# Patient Record
Sex: Male | Born: 1945
Health system: Southern US, Community
[De-identification: ages and names within clinical notes are randomized; demographics above are authoritative.]

## PROBLEM LIST (undated history)

## (undated) DIAGNOSIS — G709 Myoneural disorder, unspecified: Secondary | ICD-10-CM

## (undated) DIAGNOSIS — G894 Chronic pain syndrome: Secondary | ICD-10-CM

## (undated) DIAGNOSIS — M1990 Unspecified osteoarthritis, unspecified site: Secondary | ICD-10-CM

## (undated) HISTORY — PX: NEPHRECTOMY: SHX65

---

## 1998-09-29 ENCOUNTER — Ambulatory Visit (HOSPITAL_COMMUNITY): Admission: RE | Admit: 1998-09-29 | Discharge: 1998-09-29 | Payer: Self-pay | Admitting: Gastroenterology

## 2001-08-20 ENCOUNTER — Encounter: Payer: Self-pay | Admitting: Internal Medicine

## 2001-08-20 ENCOUNTER — Ambulatory Visit (HOSPITAL_COMMUNITY): Admission: RE | Admit: 2001-08-20 | Discharge: 2001-08-20 | Payer: Self-pay | Admitting: Internal Medicine

## 2001-12-25 ENCOUNTER — Ambulatory Visit (HOSPITAL_COMMUNITY): Admission: RE | Admit: 2001-12-25 | Discharge: 2001-12-25 | Payer: Self-pay | Admitting: Gastroenterology

## 2002-02-08 ENCOUNTER — Encounter: Admission: RE | Admit: 2002-02-08 | Discharge: 2002-02-08 | Payer: Self-pay | Admitting: Gastroenterology

## 2002-02-08 ENCOUNTER — Encounter: Payer: Self-pay | Admitting: Gastroenterology

## 2012-07-06 ENCOUNTER — Encounter (HOSPITAL_COMMUNITY): Payer: Self-pay | Admitting: Pharmacy Technician

## 2012-07-09 ENCOUNTER — Encounter (HOSPITAL_COMMUNITY)
Admission: RE | Admit: 2012-07-09 | Discharge: 2012-07-09 | Disposition: A | Discharge status: Home / Self Care | Payer: Medicare Other | Source: Ambulatory Visit | Attending: Orthopedic Surgery | Admitting: Orthopedic Surgery

## 2012-07-09 ENCOUNTER — Encounter (HOSPITAL_COMMUNITY): Payer: Self-pay

## 2012-07-09 DIAGNOSIS — G894 Chronic pain syndrome: Secondary | ICD-10-CM

## 2012-07-09 DIAGNOSIS — G709 Myoneural disorder, unspecified: Secondary | ICD-10-CM | POA: Insufficient documentation

## 2012-07-09 HISTORY — DX: Unspecified osteoarthritis, unspecified site: M19.90

## 2012-07-09 HISTORY — DX: Myoneural disorder, unspecified: G70.9

## 2012-07-09 HISTORY — DX: Chronic pain syndrome: G89.4

## 2012-07-09 LAB — CBC
MCH: 31.6 pg (ref 26.0–34.0)
MCV: 92.9 fL (ref 78.0–100.0)
Platelets: 184 10*3/uL (ref 150–400)
RBC: 4.81 MIL/uL (ref 4.22–5.81)
RDW: 13.7 % (ref 11.5–15.5)
WBC: 7.7 10*3/uL (ref 4.0–10.5)

## 2012-07-09 LAB — BASIC METABOLIC PANEL
Calcium: 10.1 mg/dL (ref 8.4–10.5)
Creatinine, Ser: 0.96 mg/dL (ref 0.50–1.35)
GFR calc non Af Amer: 86 mL/min — ABNORMAL LOW (ref >90)
Sodium: 139 mEq/L (ref 135–145)

## 2012-07-09 LAB — URINALYSIS, ROUTINE W REFLEX MICROSCOPIC
Glucose, UA: NEGATIVE mg/dL
Hgb urine dipstick: NEGATIVE
Ketones, ur: NEGATIVE mg/dL
Protein, ur: NEGATIVE mg/dL
Urobilinogen, UA: 0.2 mg/dL (ref 0.0–1.0)

## 2012-07-09 LAB — APTT: aPTT: 28 seconds (ref 24–37)

## 2012-07-09 LAB — SURGICAL PCR SCREEN: MRSA, PCR: NEGATIVE

## 2012-07-09 LAB — PROTIME-INR: Prothrombin Time: 13.8 seconds (ref 11.6–15.2)

## 2012-07-09 NOTE — Patient Instructions (Addendum)
21 Christopher Wheeler  07/09/2012   Your procedure is scheduled on:  12-24 -2013  Report to [facility] at     0630   AM..  Call this number if you have problems the morning of surgery: [phone]  Or Presurgical Testing [phone]([name])     Do not eat food:After Midnight.   Take these medicines the morning of surgery with A SIP OF WATER: Oxycodone. Oxycontin. Gabapentin. Baclofen.   Do not wear jewelry, make-up or nail polish.  Do not wear lotions, powders, or perfumes. You may wear deodorant.  Do not shave 48 hours prior to surgery.(face and neck okay, no shaving of legs)  Do not bring valuables to the hospital.  Contacts, dentures or bridgework may not be worn into surgery.  Leave suitcase in the car. After surgery it may be brought to your room.  For patients admitted to the hospital, checkout time is 11:00 AM the day of discharge.   Patients discharged the day of surgery will not be allowed to drive home. Must have responsible person with you x 24 hours once discharged.  Name and phone number of your driver: kapena, hamme [phone]  Special Instructions: CHG Shower Use Special Wash: see special instructions.(avoid face and genitals)   Please read over the following fact sheets that you were given: MRSA Information, Blood Transfusion fact sheet, Incentive Spirometry Instruction.    Failure to follow these instructions may result in Cancellation of your surgery.   Patient signature_______________________________________________________

## 2012-07-09 NOTE — Pre-Procedure Instructions (Addendum)
07-09-12 Pt. Told by pharmacy to bring empty Rx. Bottles for Oxycontin and oxycodone to verify dosages.W. Jordan Caraveo,Rn 07-09-12 EKG 3'12-report with chart.W. Kennon Portela

## 2012-07-14 ENCOUNTER — Ambulatory Visit: Payer: Medicare Other | Admitting: Family Medicine

## 2012-07-14 VITALS — BP 130/80 | HR 80 | Temp 98.6°F | Resp 18 | Ht 76.0 in | Wt 213.0 lb

## 2012-07-14 DIAGNOSIS — G587 Mononeuritis multiplex: Secondary | ICD-10-CM

## 2012-07-14 DIAGNOSIS — M169 Osteoarthritis of hip, unspecified: Secondary | ICD-10-CM

## 2012-07-14 DIAGNOSIS — E785 Hyperlipidemia, unspecified: Secondary | ICD-10-CM

## 2012-07-14 DIAGNOSIS — Z01818 Encounter for other preprocedural examination: Secondary | ICD-10-CM

## 2012-07-14 NOTE — Patient Instructions (Signed)
Your cleared for surgery. No special recommendations.

## 2012-07-14 NOTE — Progress Notes (Signed)
PREOPERATIVE MEDICAL CONSULTATION  History: Patient is here for a preop medical examination. He is scheduled to undergo a left hip arthroplasty on December 24. He has no acute medical complaints, but needed a preoperative evaluation. His left hip is degenerated and is shortened his height 1-1/2 inches on the left side from the deteriorated hip. 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 He developed arthritis in both hips, but the left. It terribly.  Past medical history: Medical problems: Allergies Mononeuritis multiplex Neuritis Right foot drop Myalgias Osteoarthritis of both hips Right side wasting Vestibular neuropathy with balance disorder and "cricket chirping" tinnitus Continual neuropathic pain Joint pains Hyperlipidemia  Previous surgeries: Surgery 1968 Colonoscopy 3 times Spermatocele 07/11/2010  Medications: Alprazolam Naproxen Cyclobenzaprine Sennosides Niacin Oxycodone Sildenafil Gabapentin Baclofen Control Cholecalciferol Lactobacillus Ergocalciferol OTC medications include: Stool softener Mineral Multivitamin msm Valerian root Garlic oil  Allergies: Doxycycline Penicillin Methadone Morphine Simvastatin  Family history: Parents are both deceased. Mother died from osteoarthritis and hypertension, father from metastatic melanoma  Social history: Patient is married. He is disabled. Formerly was a Sport and exercise psychologist, worked for [employer]. No alcohol or drug use. Nonsmoker. Has  college education. Does not exercise. Has no children.  Review of systems: Constitutional: Unremarkable HEENT: Has ear problems from his neurologic disease. This includes dizziness. Cardiovascular: Unremarkable Respiratory: Unremarkable GI: Unremarkable. Has to take up a lot of laxative agents because the medications tend toward constipation. Genitourinary: Unremarkable Musculoskeletal: Very painful left hip. He  is very weak due to her limited mobility. Neurologic: Very meticulous speech secondary to damage caused by this disease process. His mental function is good.  Psychiatric:Unremarkable Dermatologic: Unremarkable  Physical examination: Well-developed well-nourished man in no major distress. He talks with the meticulous speech as described above. HEENT. TMs normal. Eyes PERRLA. Fundi a little difficult to visualize well. Throat clear. No carotid bruits. Chest clear. Heart regular without murmurs gallops or arrhythmias. Abdomen soft without mass or tenderness. No abdominal bruits. Peripheral pulses good. Extremities not examined in detail since he cannot get up and move around for me. Has on shoes and stockings, which I did not take off. He said the orthopedic physician had checked his pulses.   PREOP MEDICAL CONSULT ASSESSMENT:: DJD left hip History of hyperlipidemia Chronic pain syndrome Mononeuritis multiplex and associated problems listed above  Plan: Labs were reviewed. EKG was done and was normal. Everything looks good for him to have surgery. He knows what medications to withhold prior to surgery. He is cleared for surgery.

## 2012-07-16 NOTE — H&P (Signed)
TOTAL HIP ADMISSION H&P  Patient is admitted for left total hip arthroplasty, anterior approach.  Subjective:  Chief Complaint:  Left hip OA / pain  HPI: Christopher Wheeler, 66 y.o. male, has a history of pain and functional disability in the left hip(s) due to arthritis and patient has failed non-surgical conservative treatments for greater than 12 weeks to include NSAID's and/or analgesics, use of assistive devices and activity modification.  Onset of symptoms was gradual starting 2 years ago with gradually worsening course since that time.  In the Spring of 2011 he felt the first "pop" in the left hip. He then had an incident in November of 2011 where the pain caused him to fall in the shower.  In September of 2012 he started wearing a 1 inch belt under his pants that encircles both hip, which makes the left hip feel better. The patient noted no past surgery on the left hip(s).  Patient currently rates pain in the left hip at 10 out of 10 with activity. Patient has night pain, worsening of pain with activity and weight bearing, trendelenberg gait, pain that interfers with activities of daily living and pain with passive range of motion. Patient has evidence of periarticular osteophytes and joint space narrowing by imaging studies. This condition presents safety issues increasing the risk of falls. There is no current active infection.  He also has mononeuritis multiplex which attributes to right sided weakness, drop foot and other issues on the right side. He already walks slowly with a walker. Risks, benefits and expectations were discussed with the patient. Patient understand the risks, benefits and expectations and wishes to proceed with surgery.   D/C Plans:  Home with HHPT vs SNF - however would prefer to go home  Post-op Meds:  No Rx given   Tranexamic Acid:  To be given  Decadron:   To be given   FYI:  Already on Oxycotin 20 mg tid, will need to continue this after surgery.          Also on  Oxycodone 5 mg tid fr break through pain, will need Oxycodone more regularly after surgery.          Aready has a regimen for controlling his bowels and would like to continue with his regimen.          Would like for PT to be aware of his mononeuritis multiplex attributing to his right sided weakness, drop foot and slow speech.   Past Medical History  Diagnosis Date  . Neuromuscular disorder 07-09-12    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, C4 lesion)-right side muscle wasting  . Arthritis     osteoarthritis-hips  . Chronic pain disorder 07-09-12    under chronic pain management through [facility]     Past Surgical History  Procedure Date  . Colonoscopy w/ polypectomy     No prescriptions prior to admission   Allergies  Allergen Reactions  . Methadone     respiration and blood pressure depressed   . Morphine And Related     respiration and blood pressure depressed   . Other     Potato: coughing Hops: coughing  . Simvastatin Nausea And Vomiting  . Doxycycline Rash  . Penicillins Rash    History  Substance Use Topics  . Smoking status: Former Smoker    Types: Cigarettes, Pipe    Quit date: 07/09/2000  . Smokeless tobacco: Not on file  . Alcohol Use: No  Comment: 1'2007-none since pain management tx.       Review of Systems  Constitutional: Negative.   HENT: Positive for hearing loss and tinnitus.   Eyes: Negative.   Respiratory: Positive for shortness of breath.   Cardiovascular: Negative.   Gastrointestinal: Negative.   Genitourinary: Negative.   Musculoskeletal: Positive for myalgias, back pain and joint pain.  Skin: Positive for rash (eczema).  Neurological: Positive for sensory change, speech change and focal weakness.  Endo/Heme/Allergies: Positive for environmental allergies.  Psychiatric/Behavioral: Negative.     Objective:  Physical Exam  Constitutional: He is oriented to person, place, and time. He appears  well-developed and well-nourished.  HENT:  Head: Normocephalic and atraumatic.  Mouth/Throat: Oropharynx is clear and moist.  Eyes: Pupils are equal, round, and reactive to light.  Neck: Neck supple. No JVD present. No tracheal deviation present. No thyromegaly present.  Cardiovascular: Normal rate, regular rhythm, normal heart sounds and intact distal pulses.   Respiratory: Effort normal and breath sounds normal. No stridor. No respiratory distress. He has no wheezes.  GI: Soft. There is no tenderness. There is no guarding.  Musculoskeletal:       Left hip: He exhibits decreased range of motion, decreased strength, tenderness and bony tenderness. He exhibits no swelling, no deformity and no laceration.  Lymphadenopathy:    He has no cervical adenopathy.  Neurological: He is alert and oriented to person, place, and time.  Skin: Skin is warm and dry.    Imaging Review Plain radiographs demonstrate severe degenerative joint disease of the left hip(s). The bone quality appears to be good for age and reported activity level.  Assessment/Plan:  End stage arthritis, left hip(s)  The patient history, physical examination, clinical judgement of the provider and imaging studies are consistent with end stage degenerative joint disease of the left hip(s) and total hip arthroplasty is deemed medically necessary. The treatment options including medical management, injection therapy, arthroscopy and arthroplasty were discussed at length. The risks and benefits of total hip arthroplasty were presented and reviewed. The risks due to aseptic loosening, infection, stiffness, dislocation/subluxation,  thromboembolic complications and other imponderables were discussed.  The patient acknowledged the explanation, agreed to proceed with the plan and consent was signed. Patient is being admitted for inpatient treatment for surgery, pain control, PT, OT, prophylactic antibiotics, VTE prophylaxis, progressive  ambulation and ADL's and discharge planning.The patient is planning to be discharged to skilled nursing facility vs home, would prefer home.     Anastasio Auerbach Kimyetta Flott   PAC  07/16/2012, 5:11 PM

## 2012-07-21 ENCOUNTER — Encounter (HOSPITAL_COMMUNITY)
Admission: RE | Disposition: A | Discharge status: Home Health | Payer: Self-pay | Source: Ambulatory Visit | Attending: Orthopedic Surgery

## 2012-07-21 ENCOUNTER — Inpatient Hospital Stay (HOSPITAL_COMMUNITY): Payer: Medicare Other

## 2012-07-21 ENCOUNTER — Inpatient Hospital Stay (HOSPITAL_COMMUNITY): Payer: Medicare Other | Admitting: Anesthesiology

## 2012-07-21 ENCOUNTER — Encounter (HOSPITAL_COMMUNITY): Payer: Self-pay | Admitting: *Deleted

## 2012-07-21 ENCOUNTER — Inpatient Hospital Stay (HOSPITAL_COMMUNITY)
Admission: RE | Admit: 2012-07-21 | Discharge: 2012-07-22 | DRG: 470 | Disposition: A | Discharge status: Home Health | Payer: Medicare Other | Source: Ambulatory Visit | Attending: Orthopedic Surgery | Admitting: Orthopedic Surgery

## 2012-07-21 ENCOUNTER — Encounter (HOSPITAL_COMMUNITY): Payer: Self-pay | Admitting: Anesthesiology

## 2012-07-21 DIAGNOSIS — M1610 Unilateral primary osteoarthritis, unspecified hip: Principal | ICD-10-CM | POA: Diagnosis present

## 2012-07-21 DIAGNOSIS — E663 Overweight: Secondary | ICD-10-CM

## 2012-07-21 DIAGNOSIS — D500 Iron deficiency anemia secondary to blood loss (chronic): Secondary | ICD-10-CM

## 2012-07-21 DIAGNOSIS — Z01812 Encounter for preprocedural laboratory examination: Secondary | ICD-10-CM

## 2012-07-21 DIAGNOSIS — M169 Osteoarthritis of hip, unspecified: Principal | ICD-10-CM | POA: Diagnosis present

## 2012-07-21 DIAGNOSIS — Z96649 Presence of unspecified artificial hip joint: Secondary | ICD-10-CM

## 2012-07-21 DIAGNOSIS — D62 Acute posthemorrhagic anemia: Secondary | ICD-10-CM | POA: Diagnosis not present

## 2012-07-21 HISTORY — PX: TOTAL HIP ARTHROPLASTY: SHX124

## 2012-07-21 LAB — ABO/RH: ABO/RH(D): A POS

## 2012-07-21 LAB — TYPE AND SCREEN: ABO/RH(D): A POS

## 2012-07-21 SURGERY — ARTHROPLASTY, HIP, TOTAL, ANTERIOR APPROACH
Anesthesia: Spinal | Site: Hip | Laterality: Left | Wound class: Clean

## 2012-07-21 MED ORDER — METOCLOPRAMIDE HCL 5 MG/ML IJ SOLN: 5.0000 mg | Freq: Three times a day (TID) | INTRAMUSCULAR | Status: DC | PRN

## 2012-07-21 MED ORDER — HYDROMORPHONE HCL PF 1 MG/ML IJ SOLN
0.5000 mg | INTRAMUSCULAR | Status: DC | PRN
Start: 2012-07-21 — End: 2012-07-22

## 2012-07-21 MED ORDER — PHENOL 1.4 % MT LIQD
1.0000 | OROMUCOSAL | Status: DC | PRN
  Filled 2012-07-21: qty 177

## 2012-07-21 MED ORDER — BISACODYL 10 MG RE SUPP: 10.0000 mg | Freq: Every day | RECTAL | Status: DC | PRN

## 2012-07-21 MED ORDER — ONDANSETRON HCL 4 MG/2ML IJ SOLN: 4.0000 mg | Freq: Four times a day (QID) | INTRAMUSCULAR | Status: DC | PRN

## 2012-07-21 MED ORDER — VITAMIN D3 25 MCG (1000 UNIT) PO TABS
1000.0000 [IU] | ORAL_TABLET | Freq: Three times a day (TID) | ORAL | Status: DC
  Administered 2012-07-21 – 2012-07-22 (×2): 1000 [IU] via ORAL
  Filled 2012-07-21 (×5): qty 1

## 2012-07-21 MED ORDER — STERILE WATER FOR IRRIGATION IR SOLN
Status: DC | PRN
  Administered 2012-07-21: 3000 mL

## 2012-07-21 MED ORDER — ONDANSETRON HCL 4 MG PO TABS: 4.0000 mg | ORAL_TABLET | Freq: Four times a day (QID) | ORAL | Status: DC | PRN

## 2012-07-21 MED ORDER — PROPOFOL 10 MG/ML IV BOLUS
INTRAVENOUS | Status: DC | PRN
  Administered 2012-07-21 (×2): 20 mg via INTRAVENOUS

## 2012-07-21 MED ORDER — DEXAMETHASONE SODIUM PHOSPHATE 10 MG/ML IJ SOLN
INTRAMUSCULAR | Status: DC | PRN
  Administered 2012-07-21: 10 mg via INTRAVENOUS

## 2012-07-21 MED ORDER — METOCLOPRAMIDE HCL 10 MG PO TABS: 5.0000 mg | ORAL_TABLET | Freq: Three times a day (TID) | ORAL | Status: DC | PRN

## 2012-07-21 MED ORDER — PROMETHAZINE HCL 25 MG/ML IJ SOLN: 6.2500 mg | INTRAMUSCULAR | Status: DC | PRN

## 2012-07-21 MED ORDER — BACLOFEN 10 MG PO TABS
10.0000 mg | ORAL_TABLET | Freq: Two times a day (BID) | ORAL | Status: DC
  Administered 2012-07-21 – 2012-07-22 (×3): 10 mg via ORAL
  Filled 2012-07-21 (×4): qty 1

## 2012-07-21 MED ORDER — LACTATED RINGERS IV SOLN
INTRAVENOUS | Status: DC | PRN
  Administered 2012-07-21 (×2): via INTRAVENOUS

## 2012-07-21 MED ORDER — OXYCODONE HCL 5 MG PO TABS
5.0000 mg | ORAL_TABLET | ORAL | Status: DC
  Administered 2012-07-21: 15 mg via ORAL
  Administered 2012-07-21 (×2): 10 mg via ORAL
  Administered 2012-07-22 (×2): 5 mg via ORAL
  Administered 2012-07-22 (×2): 10 mg via ORAL
  Filled 2012-07-21: qty 2
  Filled 2012-07-21: qty 3
  Filled 2012-07-21 (×5): qty 2

## 2012-07-21 MED ORDER — 0.9 % SODIUM CHLORIDE (POUR BTL) OPTIME
TOPICAL | Status: DC | PRN
  Administered 2012-07-21: 1000 mL

## 2012-07-21 MED ORDER — LACTINEX PO CHEW
2.0000 | CHEWABLE_TABLET | Freq: Two times a day (BID) | ORAL | Status: DC
  Administered 2012-07-21: 2 via ORAL
  Filled 2012-07-21 (×4): qty 2

## 2012-07-21 MED ORDER — ALPRAZOLAM 0.25 MG PO TABS: 0.2500 mg | ORAL_TABLET | Freq: Every evening | ORAL | Status: DC | PRN

## 2012-07-21 MED ORDER — GABAPENTIN 300 MG PO CAPS
600.0000 mg | ORAL_CAPSULE | Freq: Three times a day (TID) | ORAL | Status: DC
  Administered 2012-07-21 – 2012-07-22 (×3): 600 mg via ORAL
  Filled 2012-07-21 (×5): qty 2

## 2012-07-21 MED ORDER — NIACIN ER 500 MG PO TBCR
1000.0000 mg | EXTENDED_RELEASE_TABLET | Freq: Every day | ORAL | Status: DC
  Administered 2012-07-21: 1000 mg via ORAL
  Filled 2012-07-21 (×2): qty 2

## 2012-07-21 MED ORDER — LACTATED RINGERS IV SOLN: INTRAVENOUS | Status: DC

## 2012-07-21 MED ORDER — OMEGA-3-ACID ETHYL ESTERS 1 G PO CAPS
2.0000 g | ORAL_CAPSULE | Freq: Three times a day (TID) | ORAL | Status: DC
  Administered 2012-07-21 – 2012-07-22 (×2): 2 g via ORAL
  Filled 2012-07-21 (×5): qty 2

## 2012-07-21 MED ORDER — DIPHENHYDRAMINE HCL 25 MG PO CAPS: 25.0000 mg | ORAL_CAPSULE | Freq: Four times a day (QID) | ORAL | Status: DC | PRN

## 2012-07-21 MED ORDER — FLEET ENEMA 7-19 GM/118ML RE ENEM: 1.0000 | ENEMA | Freq: Once | RECTAL | Status: AC | PRN

## 2012-07-21 MED ORDER — SENNOSIDES 8.6 MG PO TABS
4.0000 | ORAL_TABLET | Freq: Two times a day (BID) | ORAL | Status: DC
  Administered 2012-07-21 – 2012-07-22 (×3): 34.4 mg via ORAL
  Filled 2012-07-21 (×4): qty 4

## 2012-07-21 MED ORDER — OXYCODONE HCL 20 MG PO TB12
20.0000 mg | ORAL_TABLET | Freq: Three times a day (TID) | ORAL | Status: DC
  Filled 2012-07-21 (×2): qty 1

## 2012-07-21 MED ORDER — MINERAL OIL PO OIL
45.0000 mL | TOPICAL_OIL | Freq: Three times a day (TID) | ORAL | Status: DC
  Filled 2012-07-21 (×2): qty 60

## 2012-07-21 MED ORDER — ACETAMINOPHEN 10 MG/ML IV SOLN
INTRAVENOUS | Status: DC | PRN
  Administered 2012-07-21: 1000 mg via INTRAVENOUS

## 2012-07-21 MED ORDER — NIACIN ER (ANTIHYPERLIPIDEMIC) 1000 MG PO TBCR: 1000.0000 mg | EXTENDED_RELEASE_TABLET | Freq: Every day | ORAL | Status: DC

## 2012-07-21 MED ORDER — OXYCODONE HCL ER 20 MG PO T12A
20.0000 mg | EXTENDED_RELEASE_TABLET | Freq: Three times a day (TID) | ORAL | Status: DC
Start: 2012-07-21 — End: 2012-07-22
  Administered 2012-07-21 – 2012-07-22 (×3): 20 mg via ORAL
  Filled 2012-07-21 (×3): qty 1

## 2012-07-21 MED ORDER — CELECOXIB 200 MG PO CAPS
200.0000 mg | ORAL_CAPSULE | Freq: Two times a day (BID) | ORAL | Status: DC
  Administered 2012-07-21 – 2012-07-22 (×3): 200 mg via ORAL
  Filled 2012-07-21 (×4): qty 1

## 2012-07-21 MED ORDER — FERROUS SULFATE 325 (65 FE) MG PO TABS
325.0000 mg | ORAL_TABLET | Freq: Three times a day (TID) | ORAL | Status: DC
  Administered 2012-07-21 – 2012-07-22 (×3): 325 mg via ORAL
  Filled 2012-07-21 (×6): qty 1

## 2012-07-21 MED ORDER — CEFAZOLIN SODIUM-DEXTROSE 2-3 GM-% IV SOLR
2.0000 g | Freq: Four times a day (QID) | INTRAVENOUS | Status: AC
  Administered 2012-07-21 (×2): 2 g via INTRAVENOUS
  Filled 2012-07-21 (×2): qty 50

## 2012-07-21 MED ORDER — RIVAROXABAN 10 MG PO TABS
10.0000 mg | ORAL_TABLET | ORAL | Status: DC
  Administered 2012-07-22: 10 mg via ORAL
  Filled 2012-07-21 (×2): qty 1

## 2012-07-21 MED ORDER — PROPOFOL INFUSION 10 MG/ML OPTIME
INTRAVENOUS | Status: DC | PRN
  Administered 2012-07-21: 50 ug/kg/min via INTRAVENOUS

## 2012-07-21 MED ORDER — FENTANYL CITRATE 0.05 MG/ML IJ SOLN
INTRAMUSCULAR | Status: DC | PRN
  Administered 2012-07-21: 50 ug via INTRAVENOUS

## 2012-07-21 MED ORDER — CYCLOBENZAPRINE HCL 10 MG PO TABS
10.0000 mg | ORAL_TABLET | Freq: Every day | ORAL | Status: DC
  Administered 2012-07-21: 10 mg via ORAL
  Filled 2012-07-21 (×2): qty 1

## 2012-07-21 MED ORDER — MINERAL OIL PO OIL
TOPICAL_OIL | Freq: Three times a day (TID) | ORAL | Status: DC
  Filled 2012-07-21 (×2): qty 45

## 2012-07-21 MED ORDER — MIDAZOLAM HCL 5 MG/5ML IJ SOLN
INTRAMUSCULAR | Status: DC | PRN
  Administered 2012-07-21: 2 mg via INTRAVENOUS

## 2012-07-21 MED ORDER — DEXAMETHASONE SODIUM PHOSPHATE 10 MG/ML IJ SOLN: 10.0000 mg | Freq: Once | INTRAMUSCULAR | Status: DC

## 2012-07-21 MED ORDER — TRANEXAMIC ACID 100 MG/ML IV SOLN
1450.0000 mg | Freq: Once | INTRAVENOUS | Status: AC
  Administered 2012-07-21: 1449 mg via INTRAVENOUS
  Filled 2012-07-21: qty 14.5

## 2012-07-21 MED ORDER — SODIUM CHLORIDE 0.9 % IV SOLN
100.0000 mL/h | INTRAVENOUS | Status: DC
  Administered 2012-07-21: 100 mL/h via INTRAVENOUS
  Filled 2012-07-21 (×4): qty 1000

## 2012-07-21 MED ORDER — FENTANYL CITRATE 0.05 MG/ML IJ SOLN
50.0000 ug | INTRAMUSCULAR | Status: DC | PRN
  Administered 2012-07-21 (×4): 50 ug via INTRAVENOUS

## 2012-07-21 MED ORDER — CEFAZOLIN SODIUM-DEXTROSE 2-3 GM-% IV SOLR
2.0000 g | INTRAVENOUS | Status: AC
  Administered 2012-07-21: 2 g via INTRAVENOUS

## 2012-07-21 MED ORDER — CHLORHEXIDINE GLUCONATE 4 % EX LIQD
60.0000 mL | Freq: Once | CUTANEOUS | Status: DC
  Filled 2012-07-21: qty 60

## 2012-07-21 MED ORDER — BUPIVACAINE HCL (PF) 0.5 % IJ SOLN
INTRAMUSCULAR | Status: DC | PRN
  Administered 2012-07-21: 15 mg

## 2012-07-21 MED ORDER — MENTHOL 3 MG MT LOZG
1.0000 | LOZENGE | OROMUCOSAL | Status: DC | PRN
  Filled 2012-07-21: qty 9

## 2012-07-21 MED ORDER — MINERAL OIL PO OIL
45.0000 mL | TOPICAL_OIL | Freq: Three times a day (TID) | ORAL | Status: DC
  Administered 2012-07-21 – 2012-07-22 (×3): 45 mL via ORAL
  Filled 2012-07-21 (×5): qty 45

## 2012-07-21 MED ORDER — DOCUSATE SODIUM 100 MG PO CAPS
100.0000 mg | ORAL_CAPSULE | Freq: Three times a day (TID) | ORAL | Status: DC
  Administered 2012-07-21 – 2012-07-22 (×3): 100 mg via ORAL

## 2012-07-21 MED ORDER — ALUM & MAG HYDROXIDE-SIMETH 200-200-20 MG/5ML PO SUSP: 30.0000 mL | ORAL | Status: DC | PRN

## 2012-07-21 MED ORDER — MEPERIDINE HCL 50 MG/ML IJ SOLN: 6.2500 mg | INTRAMUSCULAR | Status: DC | PRN

## 2012-07-21 MED ORDER — OXYCODONE HCL ER 20 MG PO T12A: 20.0000 mg | EXTENDED_RELEASE_TABLET | Freq: Three times a day (TID) | ORAL | Status: DC

## 2012-07-21 SURGICAL SUPPLY — 39 items
ADH SKN CLS APL DERMABOND .7 (GAUZE/BANDAGES/DRESSINGS) ×1
BAG SPEC THK2 15X12 ZIP CLS (MISCELLANEOUS) ×2
BAG ZIPLOCK 12X15 (MISCELLANEOUS) ×4 IMPLANT
BLADE SAW SGTL 18X1.27X75 (BLADE) ×2 IMPLANT
CLOTH BEACON ORANGE TIMEOUT ST (SAFETY) ×2 IMPLANT
DERMABOND ADVANCED (GAUZE/BANDAGES/DRESSINGS) ×1
DERMABOND ADVANCED .7 DNX12 (GAUZE/BANDAGES/DRESSINGS) ×1 IMPLANT
DRAPE C-ARM 42X72 X-RAY (DRAPES) ×2 IMPLANT
DRAPE STERI IOBAN 125X83 (DRAPES) ×2 IMPLANT
DRAPE U-SHAPE 47X51 STRL (DRAPES) ×6 IMPLANT
DRSG AQUACEL AG ADV 3.5X10 (GAUZE/BANDAGES/DRESSINGS) ×2 IMPLANT
DRSG TEGADERM 4X4.75 (GAUZE/BANDAGES/DRESSINGS) ×1 IMPLANT
DURAPREP 26ML APPLICATOR (WOUND CARE) ×2 IMPLANT
ELECT BLADE TIP CTD 4 INCH (ELECTRODE) ×2 IMPLANT
ELECT REM PT RETURN 9FT ADLT (ELECTROSURGICAL) ×2
ELECTRODE REM PT RTRN 9FT ADLT (ELECTROSURGICAL) ×1 IMPLANT
EVACUATOR 1/8 PVC DRAIN (DRAIN) IMPLANT
FACESHIELD LNG OPTICON STERILE (SAFETY) ×8 IMPLANT
GAUZE SPONGE 2X2 8PLY STRL LF (GAUZE/BANDAGES/DRESSINGS) ×1 IMPLANT
GLOVE BIOGEL PI IND STRL 7.5 (GLOVE) ×1 IMPLANT
GLOVE BIOGEL PI IND STRL 8 (GLOVE) ×1 IMPLANT
GLOVE BIOGEL PI INDICATOR 7.5 (GLOVE) ×1
GLOVE BIOGEL PI INDICATOR 8 (GLOVE) ×1
GLOVE ECLIPSE 8.0 STRL XLNG CF (GLOVE) ×2 IMPLANT
GLOVE ORTHO TXT STRL SZ7.5 (GLOVE) ×4 IMPLANT
GOWN BRE IMP PREV XXLGXLNG (GOWN DISPOSABLE) ×4 IMPLANT
GOWN STRL NON-REIN LRG LVL3 (GOWN DISPOSABLE) ×2 IMPLANT
KIT BASIN OR (CUSTOM PROCEDURE TRAY) ×2 IMPLANT
PACK TOTAL JOINT (CUSTOM PROCEDURE TRAY) ×2 IMPLANT
PADDING CAST COTTON 6X4 STRL (CAST SUPPLIES) ×2 IMPLANT
SPONGE GAUZE 2X2 STER 10/PKG (GAUZE/BANDAGES/DRESSINGS) ×1
SUCTION FRAZIER 12FR DISP (SUCTIONS) ×2 IMPLANT
SUT MNCRL AB 4-0 PS2 18 (SUTURE) ×2 IMPLANT
SUT VIC AB 1 CT1 36 (SUTURE) ×8 IMPLANT
SUT VIC AB 2-0 CT1 27 (SUTURE) ×4
SUT VIC AB 2-0 CT1 TAPERPNT 27 (SUTURE) ×2 IMPLANT
SUT VLOC 180 0 24IN GS25 (SUTURE) ×2 IMPLANT
TOWEL OR 17X26 10 PK STRL BLUE (TOWEL DISPOSABLE) ×4 IMPLANT
TRAY FOLEY CATH 14FRSI W/METER (CATHETERS) ×2 IMPLANT

## 2012-07-21 NOTE — Progress Notes (Signed)
Utilization review completed.  

## 2012-07-21 NOTE — Evaluation (Signed)
Occupational Therapy Evaluation Patient Details Name: Christopher Wheeler MRN: [id number] DOB: 12-Jan-1946 Today's Date: 07/21/2012 Time: 4098-1191 OT Time Calculation (min): 34 min  OT Assessment / Plan / Recommendation Clinical Impression  This 66 year old man was admitted for L THA, anterior direct approach.  He has a h/o mononeuritis with R sided weakness.  All education was completed, and pt does not need further OT.      OT Assessment  Patient does not need any further OT services    Follow Up Recommendations  No OT follow up    Barriers to Discharge      Equipment Recommendations  None recommended by OT    Recommendations for Other Services    Frequency       Precautions / Restrictions Precautions Precaution Comments: has h/o R foot drop. used AD  prior to admit. Restrictions Weight Bearing Restrictions: No   Pertinent Vitals/Pain No pain--pt states medications he takes for R side are covering any pain on L    ADL  Lower Body Dressing: Performed;Minimal assistance (pillowcase as pants, shoes, socks with AE) Where Assessed - Lower Body Dressing: Supported sit to stand Toilet Transfer: Counsellor Method: Sit to Barista:  (chair) Tub/Shower Transfer: Performed;Min guard Web designer Method: Science writer: Walk in Scientist, research (physical sciences) Used: Gait belt;Rolling walker (AE kit) Transfers/Ambulation Related to ADLs: ambulated to bathroom; stood at toilet to void ADL Comments: pt has AE from [place] but didn't use it; educated on use & pt return demonstrated    OT Diagnosis:    OT Problem List:   OT Treatment Interventions:     OT Goals    Visit Information  Last OT Received On: 07/21/12 Assistance Needed: +1    Subjective Data  Subjective: I have a sheet for you (PMH/home set up) Patient Stated Goal: be as independent as possible   Prior Functioning     Home Living Lives With:  Spouse Available Help at Discharge: Family Type of Home: House Home Access: Level entry Home Layout: One level Bathroom Shower/Tub: Health visitor: Standard Home Adaptive Equipment: Bedside commode/3-in-1;Walker - four wheeled;Walker - rolling;Reacher;Sock aid (AE kit including dressing stick) Prior Function Level of Independence: Independent with assistive device(s);Needs assistance Needs Assistance: Gait Gait Assistance: uses motorized cart in stores, uses 2 canes  when outside, uses 4 wheeled RW when needed. Vocation: Retired Film/video editor) Musician: No difficulties         Vision/Perception     Cognition  Overall Cognitive Status: Appears within functional limits for tasks assessed/performed Arousal/Alertness: Awake/alert Orientation Level: Appears intact for tasks assessed Behavior During Session: [facility] for tasks performed    Extremity/Trunk Assessment Right Upper Extremity Assessment RUE ROM/Strength/Tone: Within functional levels for tasks assessed Left Upper Extremity Assessment LUE ROM/Strength/Tone: Within functional levels Right Lower Extremity Assessment RLE ROM/Strength/Tone: Deficits RLE ROM/Strength/Tone Deficits: pt reports foot drop but did not appear to be a hinderance for gait. Left Lower Extremity Assessment LLE ROM/Strength/Tone: WFL for tasks assessed LLE Sensation: WFL - Light Touch     Mobility Bed Mobility Bed Mobility: Supine to Sit;Sitting - Scoot to Edge of Bed Supine to Sit: 5: Supervision;With rails Sitting - Scoot to Edge of Bed: 5: Supervision;With rail Details for Bed Mobility Assistance: pt. did not want PT to assist , pt sort of flung his legs together to get to edgeof bed. attempted to assist pt's L leg but he declined for need. Transfers Sit to Stand: 4:  Min guard;With upper extremity assist;From chair/3-in-1 Stand to Sit: 5: Supervision;With upper extremity assist Details for Transfer Assistance: VC  for LLE position to sit down , pt verbally reviewed backing up, reaching for arms     Shoulder Instructions     Exercise     Balance Static Sitting Balance Static Sitting - Balance Support: Bilateral upper extremity supported;Feet supported Static Sitting - Level of Assistance: 5: Stand by assistance   End of Session OT - End of Session Activity Tolerance: Patient tolerated treatment well Patient left: in chair;with call bell/phone within reach;with family/visitor present Nurse Communication:  (pt request to walk to bathroom to void)  GO     Tashawn Laswell 07/21/2012, 4:33 PM Marica Otter, OTR/L [phone] 07/21/2012

## 2012-07-21 NOTE — Op Note (Signed)
NAME:  Christopher Wheeler                ACCOUNT NO.: [id number]      MEDICAL RECORD NO.: [id number]      FACILITY:  [facility]      PHYSICIAN:  Durene Romans D  DATE OF BIRTH:  05-26-1946     DATE OF PROCEDURE:  07/21/2012                                 OPERATIVE REPORT         PREOPERATIVE DIAGNOSIS: Left  hip osteoarthritis.      POSTOPERATIVE DIAGNOSIS:  Left hip very advanced osteoarthritis.      PROCEDURE:  Left total hip replacement through an anterior approach   utilizing DePuy THR system, component size 66mm pinnacle cup, a size 36+4 neutral   Altrex liner, a size 12 Hi Tri Lock stem with a 36+5 delta ceramic   ball.      SURGEON:  Madlyn Frankel. Charlann Boxer, M.D.      ASSISTANT:  Lanney Gins, PA      ANESTHESIA:  Spinal.      SPECIMENS:  None.      COMPLICATIONS:  None.      BLOOD LOSS:  600 cc     DRAINS:  One Hemovac.      INDICATION OF THE PROCEDURE:  Christopher Wheeler is a 66 y.o. male who had   presented to office for evaluation of left hip pain.  Radiographs revealed   progressive degenerative changes with bone-on-bone   articulation to the  hip joint.  The patient had painful limited range of   motion significantly affecting their overall quality of life.  The patient was failing to    respond to conservative measures, and at this point was ready   to proceed with more definitive measures.  The patient has noted progressive   degenerative changes in his hip, progressive problems and dysfunction   with regarding the hip prior to surgery.  Consent was obtained for   benefit of pain relief.  Specific risk of infection, DVT, component   failure, dislocation, need for revision surgery, as well discussion of   the anterior versus posterior approach were reviewed.  Consent was   obtained for benefit of anterior pain relief through an anterior   approach.      PROCEDURE IN DETAIL:  The patient was brought to operative theater.   Once adequate  anesthesia, preoperative antibiotics, 2gm Ancef administered.   The patient was positioned supine on the OSI Hanna table.  Once adequate   padding of boney process was carried out, we had predraped out the hip, and  used fluoroscopy to confirm orientation of the pelvis and position.      The left hip was then prepped and draped from proximal iliac crest to   mid thigh with shower curtain technique.      Time-out was performed identifying the patient, planned procedure, and   extremity.     An incision was then made 2 cm distal and lateral to the   anterior superior iliac spine extending over the orientation of the   tensor fascia lata muscle and sharp dissection was carried down to the   fascia of the muscle and protractor placed in the soft tissues.      The fascia was then incised.  The muscle belly was  identified and swept   laterally and retractor placed along the superior neck.  Following   cauterization of the circumflex vessels and removing some pericapsular   fat, a second cobra retractor was placed on the inferior neck.  A third   retractor was placed on the anterior acetabulum after elevating the   anterior rectus.  A L-capsulotomy was along the line of the   superior neck to the trochanteric fossa, then extended proximally and   distally.  Tag sutures were placed and the retractors were then placed   intracapsular.  We then identified the trochanteric fossa and   orientation of my neck cut, confirmed this radiographically   and then made a neck osteotomy with the femur on traction.  The femoral   head was removed without difficulty or complication.  Traction was let   off and retractors were placed posterior and anterior around the   acetabulum.      The labrum and foveal tissue were debrided.  I began reaming with a 51mm   reamer and reamed up to 65mm reamer with good bony bed preparation and a 66   cup was chosen.  The final 66mm Pinnacle cup was then impacted under  fluoroscopy  to confirm the depth of penetration and orientation with respect to   abduction.  A screw was placed followed by the hole eliminator.  The final   36+4 neutral Altrex liner was impacted with good visualized rim fit.  The cup was positioned anatomically within the acetabular portion of the pelvis.      At this point, the femur was rolled at 80 degrees.  Further capsule was   released off the inferior aspect of the femoral neck.  I then   released the superior capsule proximally.  The hook was placed laterally   along the femur and elevated manually and held in position with the bed   hook.  The leg was then extended and adducted with the leg rolled to 100   degrees of external rotation.  Once the proximal femur was fully   exposed, I used a box osteotome to set orientation.  I then began   broaching with the starting chili pepper broach and passed this by hand and then broached up to 12.  With the 12 broach in place I chose a high offset neck and did a trial reduction with the 36+1.5 trial ball.  His bone was noted to be significantly osteopenic thus care was taken in preparation of both the femur and acetabulum.  Initially the trial broach was a bit higher than the cut and prepared neck.  The offset was appropriate, leg lengths   appeared to be equal, confirmed radiographically.   Given these findings, I went ahead and dislocated the hip, repositioned all   retractors and positioned the right hip in the extended and abducted position.  The final 12 Hi Tri Lock stem was   chosen and it was impacted at this point down to the level of neck cut.  Based on this   and a re-trial reduction, a 36+5 delta ceramic ball was chosen and   impacted onto a clean and dry trunnion, and the hip was reduced.  The   hip had been irrigated throughout the case again at this point.  I did   reapproximate the superior capsular leaflet to the anterior leaflet   using #1 Vicryl, placed a medium Hemovac drain  deep.  The fascia of the   tensor  fascia lata muscle was then reapproximated using #1 Vicryl.  The   remaining wound was closed with 2-0 Vicryl and running 4-0 Monocryl.   The hip was cleaned, dried, and dressed sterilely using Dermabond and   Aquacel dressing.  Drain site dressed separately.  She was then brought   to recovery room in stable condition tolerating the procedure well.    Lanney Gins, PA-C was present for the entirety of the case involved from   preoperative positioning, perioperative retractor management, general   facilitation of the case, as well as primary wound closure as assistant.            Madlyn Frankel Charlann Boxer, M.D.            MDO/MEDQ  D:  05/21/2011  T:  05/21/2011  Job:  [id number]      Electronically Signed by Durene Romans M.D. on 05/27/2011 09:15:38 AM

## 2012-07-21 NOTE — Anesthesia Postprocedure Evaluation (Signed)
  Anesthesia Post-op Note  Patient: Christopher Wheeler  Procedure(s) Performed: Procedure(s) (LRB): TOTAL HIP ARTHROPLASTY ANTERIOR APPROACH (Left)  Patient Location: PACU  Anesthesia Type: Spinal  Level of Consciousness: awake and alert   Airway and Oxygen Therapy: Patient Spontanous Breathing  Post-op Pain: mild  Post-op Assessment: Post-op Vital signs reviewed, Patient's Cardiovascular Status Stable, Respiratory Function Stable, Patent Airway and No signs of Nausea or vomiting  Last Vitals:  Filed Vitals:   07/21/12 1215  BP: 121/75  Pulse: 65  Temp: 36.9 C  Resp: 16    Post-op Vital Signs: stable   Complications: No apparent anesthesia complications

## 2012-07-21 NOTE — Interval H&P Note (Signed)
History and Physical Interval Note:  07/21/2012 8:29 AM  Christopher Wheeler  has presented today for surgery, with the diagnosis of left hip oa  The various methods of treatment have been discussed with the patient and family. After consideration of risks, benefits and other options for treatment, the patient has consented to  Procedure(s) (LRB) with comments: TOTAL HIP ARTHROPLASTY ANTERIOR APPROACH (Left) as a surgical intervention .  The patient's history has been reviewed, patient examined, no change in status, stable for surgery.  I have reviewed the patient's chart and labs.  Questions were answered to the patient's satisfaction.     [name]

## 2012-07-21 NOTE — Anesthesia Preprocedure Evaluation (Addendum)
Anesthesia Evaluation  Patient identified by MRN, date of birth, ID band Patient awake    Reviewed: Allergy & Precautions, H&P , NPO status , Patient's Chart, lab work & pertinent test results  Airway Mallampati: II TM Distance: >3 FB Neck ROM: Full    Dental No notable dental hx.    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, C4 lesion)-right side muscle wasting) negative neurological ROS  negative psych ROS   GI/Hepatic negative GI ROS, Neg liver ROS,   Endo/Other  negative endocrine ROS  Renal/GU negative Renal ROS  negative genitourinary   Musculoskeletal negative musculoskeletal ROS (+)   Abdominal   Peds negative pediatric ROS (+)  Hematology negative hematology ROS (+)   Anesthesia Other Findings   Reproductive/Obstetrics negative OB ROS                          Anesthesia Physical Anesthesia Plan  ASA: III  Anesthesia Plan: Spinal   Post-op Pain Management:    Induction:   Airway Management Planned: Simple Face Mask  Additional Equipment:   Intra-op Plan:   Post-operative Plan:   Informed Consent: I have reviewed the patients History and Physical, chart, labs and discussed the procedure including the risks, benefits and alternatives for the proposed anesthesia with the patient or authorized representative who has indicated his/her understanding and acceptance.   Dental advisory given  Plan Discussed with: CRNA  Anesthesia Plan Comments:         Anesthesia Quick Evaluation

## 2012-07-21 NOTE — Transfer of Care (Signed)
Immediate Anesthesia Transfer of Care Note  Patient: Christopher Wheeler  Procedure(s) Performed: Procedure(s) (LRB): TOTAL HIP ARTHROPLASTY ANTERIOR APPROACH (Left)  Patient Location: PACU  Anesthesia Type: Spinal  Level of Consciousness: sedated, patient cooperative and responds to stimulaton  Airway & Oxygen Therapy: Patient Spontanous Breathing and Patient connected to face mask oxgen  Post-op Assessment: Report given to PACU RN and Post -op Vital signs reviewed and stable  Post vital signs: Reviewed and stable  Complications: No apparent anesthesia complications

## 2012-07-21 NOTE — Anesthesia Procedure Notes (Signed)
Spinal  Patient location during procedure: OR Start time: 07/21/2012 8:44 AM End time: 07/21/2012 8:49 AM Staffing CRNA/Resident: Paris Lore Performed by: resident/CRNA  Preanesthetic Checklist Completed: patient identified, site marked, surgical consent, pre-op evaluation, timeout performed, IV checked, risks and benefits discussed and monitors and equipment checked Spinal Block Patient position: sitting Prep: Betadine Patient monitoring: heart rate, continuous pulse ox and blood pressure Approach: right paramedian Location: L2-3 Injection technique: single-shot Needle Needle type: Spinocan  Needle gauge: 22 G Needle length: 9 cm Assessment Sensory level: T4 Additional Notes Expiration date of kit checked and confirmed. Patient tolerated procedure well, without complications. Placed X 1 attempt without difficulty clear CSF return easy administration.

## 2012-07-22 DIAGNOSIS — E663 Overweight: Secondary | ICD-10-CM

## 2012-07-22 DIAGNOSIS — D500 Iron deficiency anemia secondary to blood loss (chronic): Secondary | ICD-10-CM

## 2012-07-22 LAB — CBC
HCT: 33.6 % — ABNORMAL LOW (ref 39.0–52.0)
Hemoglobin: 11.3 g/dL — ABNORMAL LOW (ref 13.0–17.0)
MCH: 31.3 pg (ref 26.0–34.0)
MCV: 93.1 fL (ref 78.0–100.0)
RBC: 3.61 MIL/uL — ABNORMAL LOW (ref 4.22–5.81)

## 2012-07-22 LAB — BASIC METABOLIC PANEL
BUN: 13 mg/dL (ref 6–23)
CO2: 26 mEq/L (ref 19–32)
Calcium: 8.7 mg/dL (ref 8.4–10.5)
Creatinine, Ser: 0.9 mg/dL (ref 0.50–1.35)
Glucose, Bld: 113 mg/dL — ABNORMAL HIGH (ref 70–99)

## 2012-07-22 MED ORDER — OXYCODONE HCL 5 MG PO TABS: 5.0000 mg | ORAL_TABLET | ORAL | Status: DC | PRN

## 2012-07-22 MED ORDER — FERROUS SULFATE 325 (65 FE) MG PO TABS: 325.0000 mg | ORAL_TABLET | Freq: Three times a day (TID) | ORAL | Status: DC

## 2012-07-22 MED ORDER — ASPIRIN 325 MG PO TABS: 325.0000 mg | ORAL_TABLET | Freq: Two times a day (BID) | ORAL | Status: DC

## 2012-07-22 MED ORDER — DIPHENHYDRAMINE HCL 25 MG PO CAPS: 25.0000 mg | ORAL_CAPSULE | Freq: Four times a day (QID) | ORAL | Status: DC | PRN

## 2012-07-22 NOTE — Progress Notes (Signed)
   Subjective: 1 Day Post-Op Procedure(s) (LRB): TOTAL HIP ARTHROPLASTY ANTERIOR APPROACH (Left)   Patient reports pain as mild, pain well controlled. No events throughout the night. Ready to be discharged home, if he does well with PT.  Objective:   VITALS:   Filed Vitals:   07/22/12 0605  BP: 95/57  Pulse: 73  Temp: 98.7 F (37.1 C)  Resp: 16    Neurovascular intact Dorsiflexion/Plantar flexion intact Incision: dressing C/D/I No cellulitis present Compartment soft  LABS  Basename 07/22/12 0433  HGB 11.3*  HCT 33.6*  WBC 7.3  PLT 149*     Basename 07/22/12 0433  NA 138  K 3.9  BUN 13  CREATININE 0.90  GLUCOSE 113*     Assessment/Plan: 1 Day Post-Op Procedure(s) (LRB): TOTAL HIP ARTHROPLASTY ANTERIOR APPROACH (Left) HV drain d/c'ed Foley cath d/c'ed Advance diet Up with therapy D/C IV fluids Will do PT as an outpatient, Rx on chart Discharge home Follow up in 2 weeks at [facility]. Follow up with OLIN,Armaan Pond D in 2 weeks.  Contact information:  [facility] [address] [place] [address] [phone]    Expected ABLA  Treated with iron and will observe  Overweight (BMI 25-29.9)  Estimated Body mass index is 26.05 kg/(m^2) as calculated from the following:   Height as of this encounter: 6\' 4" (1.93 m).   Weight as of this encounter: 214 lb(97.07 kg). Patient also counseled that weight may inhibit the healing process Patient counseled that losing weight will help with future health issues       Christopher Wheeler   PAC  07/22/2012, 9:21 AM

## 2012-07-22 NOTE — Progress Notes (Signed)
Physical Therapy Treatment Patient Details Name: Christopher Wheeler MRN: [id number] DOB: May 02, 1946 Today's Date: 07/22/2012 Time:  -     PT Assessment / Plan / Recommendation Comments on Treatment Session  Reviewed car transfers with pt.  Home ther ex handout provided.  Pt participatory but with limited recepetiveness to input    Follow Up Recommendations  Home health PT (Pt states he is going straight to OP)     Does the patient have the potential to tolerate intense rehabilitation     Barriers to Discharge        Equipment Recommendations  None recommended by PT    Recommendations for Other Services    Frequency 7X/week   Plan Discharge plan remains appropriate    Precautions / Restrictions Precautions Precautions: Fall Precaution Comments: has h/o R foot drop. used AD  prior to admit. Restrictions Weight Bearing Restrictions: No   Pertinent Vitals/Pain "its just acutely sore"; Premedicated, ice packs provided    Mobility  Bed Mobility Bed Mobility: Supine to Sit;Sit to Supine Supine to Sit: 4: Min assist;4: Min guard Sitting - Scoot to [place] of Bed: 4: Min guard;4: Min assist Details for Bed Mobility Assistance: Pt states "I have to do this all at once"  Pt with limited receptiveness to cues/suggestions Transfers Transfers: Sit to Stand;Stand to Sit Sit to Stand: 4: Min guard;With upper extremity assist;From chair/3-in-1;5: Supervision Stand to Sit: 4: Min guard;5: Supervision Details for Transfer Assistance: cues for use of UEs to self assist Ambulation/Gait Ambulation/Gait Assistance: 4: Min guard;5: Supervision Assistive device: Rolling walker Gait Pattern: Step-through pattern;Decreased stance time - left General Gait Details: Pt with limited receptiveness to cues "I could not stand straight before surgery because of my hip"  "One leg was longer than the other and now its not" Stairs: Yes Stairs Assistance: 4: Min assist Stair Management Technique: No rails;Two  rails;Forwards;With walker    Exercises Total Joint Exercises Ankle Circles/Pumps: AROM;15 reps;Supine;Both Quad Sets: AROM;Both;15 reps;Supine Gluteal Sets: AROM;Both;10 reps;Supine Heel Slides: AAROM;Supine;20 reps;Left Hip ABduction/ADduction: AAROM;15 reps;Supine;Left   PT Diagnosis:    PT Problem List:   PT Treatment Interventions:     PT Goals Acute Rehab PT Goals PT Goal Formulation: With patient/family Time For Goal Achievement: 07/28/12 Potential to Achieve Goals: Good Pt will go Supine/Side to Sit: with modified independence Pt will go Sit to Supine/Side: with modified independence Pt will go Sit to Stand: with supervision Pt will go Stand to Sit: with supervision Pt will Ambulate: >150 feet;with supervision;with rolling walker Pt will Perform Home Exercise Program: with supervision, verbal cues required/provided  Visit Information  Last PT Received On: 07/22/12 Assistance Needed: +1    Subjective Data  Subjective: I feel confident in my abillity to move around and ready to go home Patient Stated Goal: for my legs to be equal length   Cognition  Overall Cognitive Status: Appears within functional limits for tasks assessed/performed Arousal/Alertness: Awake/alert Orientation Level: Appears intact for tasks assessed Behavior During Session: [facility] for tasks performed    Balance     End of Session PT - End of Session Activity Tolerance: Patient tolerated treatment well Patient left: in chair;with call bell/phone within reach;with family/visitor present   GP     Christopher Wheeler 07/22/2012, 2:23 PM

## 2012-07-23 ENCOUNTER — Encounter (HOSPITAL_COMMUNITY): Payer: Self-pay | Admitting: Orthopedic Surgery

## 2012-07-23 NOTE — Evaluation (Signed)
Physical Therapy Evaluation/Late entry for 12/24 evaluation Patient Details Name: Christopher Wheeler MRN: [id number] DOB: 10-May-1946 Today's Date: 07/23/2012 Time: 4098-1191 PT Time Calculation (min): 27 min  PT Assessment / Plan / Recommendation Clinical Impression  Pt.s 66 yo male admitted for L direct Anterior THA 12/24. Pt.has a h/o mononeuritis with R sided weakness and drop foot. Pt. tolerated ambulation in the room . Pt. will benefit from PT to improve infunctional mobility and strength to DC to home.    PT Assessment  Patient needs continued PT services    Follow Up Recommendations  Home health PT    Does the patient have the potential to tolerate intense rehabilitation      Barriers to Discharge        Equipment Recommendations  None recommended by PT    Recommendations for Other Services     Frequency 7X/week    Precautions / Restrictions Precautions Precaution Comments: has h/o R foot drop. used AD  prior to admit.   Pertinent Vitals/Pain       Mobility  Bed Mobility Bed Mobility: Supine to Sit;Sitting - Scoot to Edge of Bed Supine to Sit: 5: Supervision;With rails Sitting - Scoot to Edge of Bed: 5: Supervision;With rail Details for Bed Mobility Assistance: pt. did not want PT to assist , pt sort of flung his legs together to get to edgeof bed. attempted to assist pt's L leg but he declined for need. Transfers Transfers: Sit to Stand;Stand to Sit Sit to Stand: 4: Min guard;With upper extremity assist Stand to Sit: 5: Supervision;With upper extremity assist Details for Transfer Assistance: VC for LLE position to sit down , pt verbally reviewed backing up, reaching for arms Ambulation/Gait Ambulation/Gait Assistance: 4: Min guard Ambulation Distance (Feet): 15 Feet Assistive device: Rolling walker Ambulation/Gait Assistance Details: cues for sequnence and position inside RW Gait Pattern: Step-through pattern;Decreased stance time - left Gait velocity:  decreased    Shoulder Instructions     Exercises     PT Diagnosis: Difficulty walking  PT Problem List: Decreased strength;Decreased range of motion;Decreased activity tolerance;Decreased mobility;Decreased knowledge of use of DME PT Treatment Interventions: DME instruction;Gait training;Functional mobility training;Therapeutic activities;Therapeutic exercise;Patient/family education   PT Goals Acute Rehab PT Goals PT Goal Formulation: With patient/family Time For Goal Achievement: 07/28/12 Potential to Achieve Goals: Good Pt will go Supine/Side to Sit: with modified independence PT Goal: Supine/Side to Sit - Progress: Goal set today Pt will go Sit to Supine/Side: with modified independence PT Goal: Sit to Supine/Side - Progress: Goal set today Pt will go Sit to Stand: with supervision PT Goal: Sit to Stand - Progress: Goal set today Pt will go Stand to Sit: with supervision PT Goal: Stand to Sit - Progress: Goal set today Pt will Ambulate: >150 feet;with supervision;with rolling walker PT Goal: Ambulate - Progress: Goal set today Pt will Perform Home Exercise Program: with supervision, verbal cues required/provided PT Goal: Perform Home Exercise Program - Progress: Goal set today  Visit Information  Last PT Received On: 07/21/12 Assistance Needed: +1    Subjective Data  Subjective: I have neuritis. Patient Stated Goal: for my legs to be equal length   Prior Functioning  Home Living Lives With: Spouse Available Help at Discharge: Family Type of Home: House Home Access: Level entry Home Layout: One level Home Adaptive Equipment: Bedside commode/3-in-1;Walker - four wheeled;Walker - rolling Prior Function Level of Independence: Independent with assistive device(s);Needs assistance Needs Assistance: Gait Gait Assistance: uses motorized cart in stores, uses  2 canes  when outside, uses 4 wheeled RW when needed. Vocation: Retired Musician: No  difficulties    Cognition  Overall Cognitive Status: Appears within functional limits for tasks assessed/performed Arousal/Alertness: Awake/alert Orientation Level: Appears intact for tasks assessed Behavior During Session: [facility] for tasks performed    Extremity/Trunk Assessment Right Lower Extremity Assessment RLE ROM/Strength/Tone: Deficits RLE ROM/Strength/Tone Deficits: pt reports foot drop but did not appear to be a hinderance for gait. Left Lower Extremity Assessment LLE ROM/Strength/Tone: WFL for tasks assessed LLE Sensation: WFL - Light Touch   Balance Static Sitting Balance Static Sitting - Balance Support: Bilateral upper extremity supported;Feet supported Static Sitting - Level of Assistance: 5: Stand by assistance  End of Session PT - End of Session Activity Tolerance: Patient tolerated treatment well Patient left: in chair;with call bell/phone within reach;with family/visitor present Nurse Communication: Mobility status  GP     Rada Hay 07/23/2012, 7:03 AM  [phone]

## 2012-07-23 NOTE — Discharge Summary (Signed)
Physician Discharge Summary  Patient ID: MONROE QIN MRN: [id number] DOB/AGE: Dec 19, 1945 66 y.o.  Admit date: 07/21/2012 Discharge date: 07/22/2012   Procedures:  Procedure(s) (LRB): TOTAL HIP ARTHROPLASTY ANTERIOR APPROACH (Left)  Attending Physician:  Dr. 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  Arthritis osteoarthritis-hips   Chronic pain disorder [facility]  HPI: Christopher Wheeler, 66 y.o. male, has a history of pain and functional disability in the left hip(s) due to arthritis and patient has failed non-surgical conservative treatments for greater than 12 weeks to include NSAID's and/or analgesics, use of assistive devices and activity modification. Onset of symptoms was gradual starting 2 years ago with gradually worsening course since that time. In the Spring of 2011 he felt the first "pop" in the left hip. He then had an incident in November of 2011 where the pain caused him to fall in the shower. In September of 2012 he started wearing a 1 inch belt under his pants that encircles both hip, which makes the left hip feel better. The patient noted no past surgery on the left hip(s). Patient currently rates pain in the left hip at 10 out of 10 with activity. Patient has night pain, worsening of pain with activity and weight bearing, trendelenberg gait, pain that interfers with activities of daily living and pain with passive range of motion. Patient has evidence of periarticular osteophytes and joint space narrowing by imaging studies. This condition presents safety issues increasing the risk of falls. There is no current active infection. He also has mononeuritis  multiplex which attributes to right sided weakness, drop foot and other issues on the right side. He already walks slowly with a walker. Risks, benefits and expectations were discussed with the patient. Patient understand the risks, benefits and expectations and wishes to proceed with surgery.  PCP: No primary provider on file.   Discharged Condition: good  Hospital Course:  Patient underwent the above stated procedure on 07/21/2012. Patient tolerated the procedure well and brought to the recovery room in good condition and subsequently to the floor.  POD #1 BP: 95/57 ; Pulse: 73 ; Temp: 98.7 F (37.1 C) ; Resp: 16  Pt's foley was removed, as well as the hemovac drain removed. IV was changed to a saline lock. Patient reports pain as mild, pain well controlled. No events throughout the night. He did very well with PT and ready to be discharged home. Neurovascular intact, dorsiflexion/plantar flexion intact, incision: dressing C/D/I, no cellulitis present and compartment soft.   LABS  Basename  07/22/12 0433   HGB  11.3  HCT  33.6    Discharge Exam: General appearance: alert and no distress Extremities: Homans sign is negative, no sign of DVT, no edema, redness or tenderness in the calves or thighs and no ulcers, gangrene or trophic changes  Disposition:   Home or Self Care with follow up in 2 weeks   Follow-up Information    Follow up with [name], MD. Schedule an appointment as soon as possible for a visit in 2 weeks.   Contact information:   [address] [place] [address] [phone]          Discharge Orders  Future Orders Please Complete By Expires   Diet - low sodium heart healthy      Call MD / Call 911      Comments:   If you experience chest pain or shortness of breath, CALL 911 and be transported to the hospital emergency room.  If you develope a fever above 101 F, pus (white drainage) or increased drainage or redness at the wound, or  calf pain, call your surgeon's office.   Discharge instructions      Comments:   Maintain surgical dressing for 10-14 days, then replace with gauze and tape. Keep the area dry and clean until follow up. Follow up in 2 weeks at [facility]. Call with any questions or concerns.   Constipation Prevention      Comments:   Drink plenty of fluids.  Prune juice may be helpful.  You may use a stool softener, such as Colace (over the counter) 100 mg twice a day.  Use MiraLax (over the counter) for constipation as needed.   Increase activity slowly as tolerated      Driving restrictions      Comments:   No driving for 4 weeks   Change dressing      Comments:   Maintain surgical dressing for 10-14 days, then replace with 4x4 guaze and tape. Keep the area dry and clean.   TED hose      Comments:   Use stockings (TED hose) for 2 weeks on both leg(s).  You may remove them at night for sleeping.      Discharge Medication List as of 07/22/2012 10:47 AM    START taking these medications   Details  diphenhydrAMINE (BENADRYL) 25 mg capsule Take 1 capsule (25 mg total) by mouth every 6 (six) hours as needed for itching, allergies or sleep., Starting 07/22/2012, Until Discontinued, No Print    ferrous sulfate 325 (65 FE) MG tablet Take 1 tablet (325 mg total) by mouth 3 (three) times daily after meals., Starting 07/22/2012, Until Discontinued, No Print      CONTINUE these medications which have CHANGED   Details  aspirin 325 MG tablet Take 1 tablet (325 mg total) by mouth 2 (two) times daily., Starting 07/22/2012, Until Discontinued, No Print    oxyCODONE (OXY IR/ROXICODONE) 5 MG immediate release tablet Take 1-3 tablets (5-15 mg total) by mouth every 4 (four) hours as needed for pain., Starting 07/22/2012, Until Discontinued, Print      CONTINUE these medications which have NOT CHANGED   Details  ALPRAZolam (XANAX) 0.25 MG tablet Take 0.25 mg by mouth at bedtime as needed. For sleep,  Until Discontinued, Historical Med    baclofen (LIORESAL) 10 MG tablet Take 10 mg by mouth 2 (two) times daily., Until Discontinued, Historical Med    cyclobenzaprine (FLEXERIL) 10 MG tablet Take 10 mg by mouth at bedtime., Until Discontinued, Historical Med    docusate sodium (COLACE) 100 MG capsule Take 100 mg by mouth 3 (three) times daily. Patient takes this reguarly, Until Discontinued, Historical Med    fish oil-omega-3 fatty acids 1000 MG capsule Take 2 g by mouth 3 (three) times daily., Until Discontinued, Historical Med    gabapentin (NEURONTIN) 300 MG capsule Take 600 mg by mouth 3 (three) times daily., Until Discontinued, Historical Med    Lactobacillus CHEW Chew 2 tablets by mouth 2 (two) times daily., Until Discontinued, Historical Med    mineral oil liquid Take 45 mLs by mouth 3 (three) times daily. Patient takes  this regularly, Until Discontinued, Historical Med    niacin (NIASPAN) 1000 MG CR tablet Take 1,000 mg by mouth at bedtime., Until Discontinued, Historical Med    oxyCODONE (OXYCONTIN) 20 MG 12 hr tablet Take 20 mg by mouth 3 (three) times daily., Until Discontinued, Historical Med    senna (SENOKOT) 8.6 MG tablet Take 4 tablets by mouth 2 (two) times daily. Patient takes this regularly, Until Discontinued, Historical Med    sildenafil (VIAGRA) 50 MG tablet Take 50 mg by mouth daily as needed., Until Discontinued, Historical Med    Cholecalciferol 10000 UNITS CAPS Take 1,000 Units by mouth 3 (three) times daily., Until Discontinued, Historical Med    Garlic Oil 1000 MG CAPS Take 1,000 capsules by mouth daily., Until Discontinued, Historical Med    Multiple Vitamin (MULTIVITAMIN WITH MINERALS) TABS Take 1 tablet by mouth daily., Until Discontinued, Historical Med    OVER THE COUNTER MEDICATION Take 1,000 capsules by mouth 3 (three) times daily. Methyl-sulfonyl-methane, Until Discontinued, Historical Med    Valerian CAPS Take 3 capsules by mouth daily. 1605 mg  capsules, Until Discontinued, Historical Med    Vitamin D, Ergocalciferol, (DRISDOL) 50000 UNITS CAPS Take 50,000 Units by mouth every 30 (thirty) days., Until Discontinued, Historical Med      STOP taking these medications     naproxen (NAPROSYN) 500 MG tablet Comments:  Reason for Stopping:           Signed: Anastasio Auerbach. Reah Justo   PAC  07/23/2012, 10:34 AM

## 2014-07-29 HISTORY — PX: COLONOSCOPY W/ POLYPECTOMY: SHX1380

## 2015-05-18 DIAGNOSIS — H524 Presbyopia: Secondary | ICD-10-CM | POA: Diagnosis not present

## 2015-05-18 DIAGNOSIS — H5213 Myopia, bilateral: Secondary | ICD-10-CM | POA: Diagnosis not present

## 2015-05-18 DIAGNOSIS — H52223 Regular astigmatism, bilateral: Secondary | ICD-10-CM | POA: Diagnosis not present

## 2015-05-18 DIAGNOSIS — H43399 Other vitreous opacities, unspecified eye: Secondary | ICD-10-CM | POA: Diagnosis not present

## 2015-05-18 DIAGNOSIS — H2513 Age-related nuclear cataract, bilateral: Secondary | ICD-10-CM | POA: Diagnosis not present

## 2015-05-18 DIAGNOSIS — H04129 Dry eye syndrome of unspecified lacrimal gland: Secondary | ICD-10-CM | POA: Diagnosis not present

## 2016-10-08 ENCOUNTER — Telehealth: Payer: Self-pay | Admitting: *Deleted

## 2016-10-08 NOTE — Telephone Encounter (Signed)
agree

## 2016-10-08 NOTE — Telephone Encounter (Signed)
Per pt and spouse. After seeing Dr. Etter Sjogren on today's visit pt's spouse says that provider agreed to take him on as a new patient.   Scheduled appt.

## 2016-10-22 DIAGNOSIS — H2513 Age-related nuclear cataract, bilateral: Secondary | ICD-10-CM | POA: Diagnosis not present

## 2016-10-22 DIAGNOSIS — H5213 Myopia, bilateral: Secondary | ICD-10-CM | POA: Diagnosis not present

## 2016-10-22 DIAGNOSIS — H52223 Regular astigmatism, bilateral: Secondary | ICD-10-CM | POA: Diagnosis not present

## 2016-11-07 DIAGNOSIS — Z4789 Encounter for other orthopedic aftercare: Secondary | ICD-10-CM | POA: Diagnosis not present

## 2016-11-07 DIAGNOSIS — M1611 Unilateral primary osteoarthritis, right hip: Secondary | ICD-10-CM | POA: Diagnosis not present

## 2016-11-07 DIAGNOSIS — M25551 Pain in right hip: Secondary | ICD-10-CM | POA: Diagnosis not present

## 2016-11-07 DIAGNOSIS — Z96642 Presence of left artificial hip joint: Secondary | ICD-10-CM | POA: Diagnosis not present

## 2016-11-14 ENCOUNTER — Telehealth: Payer: Self-pay | Admitting: *Deleted

## 2016-11-14 NOTE — Telephone Encounter (Signed)
NO ANSWER

## 2016-11-20 ENCOUNTER — Telehealth: Payer: Self-pay | Admitting: Behavioral Health

## 2016-11-20 NOTE — Telephone Encounter (Signed)
Unable to reach patient at time of Pre-Visit Call. Voicemail unavailable.

## 2016-11-21 ENCOUNTER — Encounter: Payer: Self-pay | Admitting: Family Medicine

## 2016-11-21 ENCOUNTER — Ambulatory Visit (INDEPENDENT_AMBULATORY_CARE_PROVIDER_SITE_OTHER): Payer: Medicare Other | Admitting: Family Medicine

## 2016-11-21 DIAGNOSIS — M160 Bilateral primary osteoarthritis of hip: Secondary | ICD-10-CM | POA: Diagnosis not present

## 2016-11-21 DIAGNOSIS — G709 Myoneural disorder, unspecified: Secondary | ICD-10-CM

## 2016-11-21 NOTE — Progress Notes (Signed)
Subjective:  I acted as a Education administrator for Dr. Royden Purl, LPN    Patient ID: Christopher Wheeler, male    DOB: 06-12-1946, 71 y.o.   MRN: [id number]  Chief Complaint  Patient presents with  . New Patient (Initial Visit)    HPI  Patient is in today as new patient to establish care.  Pt goes to [place] for his care. He needs a civilian pcp for clearance when he needs surgery.   Patient Care Team: Ann Held, DO as PCP - General (Family Medicine)   Past Medical History:  Diagnosis Date  . Arthritis    osteoarthritis-hips  . Chronic pain disorder 07-09-12   under chronic pain management through [place]   . 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 COLONOSCOPY W/ POLYPECTOMY  2016   [facility]  . TOTAL HIP ARTHROPLASTY  07/21/2012   Procedure: TOTAL HIP ARTHROPLASTY ANTERIOR APPROACH;  Surgeon: Mauri Pole, MD;  Location: [facility];  Service: Orthopedics;  Laterality: Left;    Family History  Problem Relation Age of Onset  . Diabetes Sister   . Diabetes Mellitus II Paternal Grandmother     Social History   Social History  . Marital status: Married    Spouse name: N/A  . Number of children: N/A  . Years of education: N/A   Occupational History  . Not on file.   Social History Main Topics  . Smoking status: Former Smoker    Types: Cigarettes, Pipe    Quit date: 07/09/2000  . Smokeless tobacco: Never Used  . Alcohol use No     Comment: 1'2007-none since pain management tx.  . Drug use: No  . Sexual activity: Yes   Other Topics Concern  . Not on file   Social History Narrative  . No narrative on file    Outpatient Medications Prior to Visit  Medication Sig Dispense Refill  . ALPRAZolam (XANAX) 0.25 MG tablet Take 0.25 mg by mouth at bedtime as needed. For sleep    . aspirin 325 MG tablet Take  1 tablet (325 mg total) by mouth 2 (two) times daily.    . baclofen (LIORESAL) 10 MG tablet Take 10 mg by mouth 4 (four) times daily.     . Cholecalciferol 10000 UNITS CAPS Take 1,000 Units by mouth 5 (five) times daily.     . cyclobenzaprine (FLEXERIL) 10 MG tablet Take 10 mg by mouth at bedtime.    . diphenhydrAMINE (BENADRYL) 25 mg capsule Take 1 capsule (25 mg total) by mouth every 6 (six) hours as needed for itching, allergies or sleep. 30 capsule   . docusate sodium (COLACE) 100 MG capsule Take 100 mg by mouth 3 (three) times daily. Patient takes this reguarly    . ferrous sulfate 325 (65 FE) MG tablet Take 1 tablet (325 mg total) by mouth 3 (three) times daily after meals.    . fish oil-omega-3 fatty acids 1000 MG capsule Take 2 g by mouth 3 (three) times daily.    Marland Kitchen gabapentin (NEURONTIN) 300 MG capsule Take 600 mg by mouth 3 (three) times daily.    . Garlic Oil 9417 MG CAPS Take 1,000 capsules by mouth daily.    . Lactobacillus CHEW Chew 2 tablets by mouth 2 (two) times daily.    . mineral oil liquid Take  45 mLs by mouth 3 (three) times daily. Patient takes this regularly    . Multiple Vitamin (MULTIVITAMIN WITH MINERALS) TABS Take 1 tablet by mouth daily.    . niacin (NIASPAN) 1000 MG CR tablet Take 1,000 mg by mouth at bedtime.    Marland Kitchen OVER THE COUNTER MEDICATION Take 1,000 capsules by mouth 3 (three) times daily. Methyl-sulfonyl-methane    . oxyCODONE (OXY IR/ROXICODONE) 5 MG immediate release tablet Take 1-3 tablets (5-15 mg total) by mouth every 4 (four) hours as needed for pain. 100 tablet 0  . oxyCODONE (OXYCONTIN) 20 MG 12 hr tablet Take 20 mg by mouth 3 (three) times daily.    Marland Kitchen senna (SENOKOT) 8.6 MG tablet Take 4 tablets by mouth 2 (two) times daily. Patient takes this regularly    . sildenafil (VIAGRA) 50 MG tablet Take 50 mg by mouth daily as needed.    Cristino Martes CAPS Take 3 capsules by mouth daily. 1605 mg capsules    . Vitamin D, Ergocalciferol, (DRISDOL) 50000 UNITS CAPS  Take 50,000 Units by mouth every 30 (thirty) days.     No facility-administered medications prior to visit.     Allergies  Allergen Reactions  . Methadone     respiration and blood pressure depressed   . Morphine And Related     respiration and blood pressure depressed   . Other     Potato: coughing Hops: coughing  . Simvastatin Nausea And Vomiting  . Doxycycline Rash  . Penicillins Rash    Review of Systems  Constitutional: Negative for chills, fever and malaise/fatigue.  HENT: Negative for congestion and hearing loss.   Eyes: Negative for discharge.  Respiratory: Negative for cough, sputum production and shortness of breath.   Cardiovascular: Negative for chest pain, palpitations and leg swelling.  Gastrointestinal: Negative for abdominal pain, blood in stool, constipation, diarrhea, heartburn, nausea and vomiting.  Genitourinary: Negative for dysuria, frequency, hematuria and urgency.  Skin: Negative for rash.  Neurological: Negative for dizziness, sensory change, loss of consciousness, weakness and headaches.  Endo/Heme/Allergies: Negative for environmental allergies. Does not bruise/bleed easily.  Psychiatric/Behavioral: Negative for depression and suicidal ideas. The patient is not nervous/anxious and does not have insomnia.        Objective:    Physical Exam  Constitutional: He is oriented to person, place, and time. Vital signs are normal. He appears well-developed and well-nourished. He is sleeping.  HENT:  Head: Normocephalic and atraumatic.  Mouth/Throat: Oropharynx is clear and moist.  Eyes: EOM are normal. Pupils are equal, round, and reactive to light.  Neck: Normal range of motion. Neck supple. No thyromegaly present.  Cardiovascular: Normal rate and regular rhythm.   No murmur heard. Pulmonary/Chest: Effort normal and breath sounds normal. No respiratory distress. He has no wheezes. He has no rales. He exhibits no tenderness.  Musculoskeletal: He exhibits  no edema or tenderness.  Neurological: He is alert and oriented to person, place, and time.  Skin: Skin is warm and dry.  Psychiatric: He has a normal mood and affect. His behavior is normal. Judgment and thought content normal.  Nursing note and vitals reviewed.   BP 138/82 (BP Location: Left Arm, Patient Position: Sitting, Cuff Size: Normal)   Pulse 79   Temp 98.6 F (37 C) (Oral)   Resp 16   Ht 6\' 4"  (1.93 m)   Wt 212 lb 3.2 oz (96.3 kg)   SpO2 98%   BMI 25.83 kg/m  Wt Readings from Last 3 Encounters:  11/21/16 212 lb 3.2 oz (96.3 kg)  07/21/12 214 lb (97.1 kg)  07/14/12 213 lb (96.6 kg)   BP Readings from Last 3 Encounters:  11/21/16 138/82  07/22/12 (!) 95/57  07/14/12 130/80      There is no immunization history on file for this patient.  Health Maintenance  Topic Date Due  . Hepatitis C Screening  10-06-1945  . COLONOSCOPY  08/12/2017  . TETANUS/TDAP  11/14/2025    Lab Results  Component Value Date   WBC 7.3 07/22/2012   HGB 11.3 (L) 07/22/2012   HCT 33.6 (L) 07/22/2012   PLT 149 (L) 07/22/2012   GLUCOSE 113 (H) 07/22/2012   NA 138 07/22/2012   K 3.9 07/22/2012   CL 104 07/22/2012   CREATININE 0.90 07/22/2012   BUN 13 07/22/2012   CO2 26 07/22/2012   INR 1.07 07/09/2012    No results found for: TSH Lab Results  Component Value Date   WBC 7.3 07/22/2012   HGB 11.3 (L) 07/22/2012   HCT 33.6 (L) 07/22/2012   MCV 93.1 07/22/2012   PLT 149 (L) 07/22/2012   Lab Results  Component Value Date   NA 138 07/22/2012   K 3.9 07/22/2012   CO2 26 07/22/2012   GLUCOSE 113 (H) 07/22/2012   BUN 13 07/22/2012   CREATININE 0.90 07/22/2012   CALCIUM 8.7 07/22/2012   No results found for: CHOL No results found for: HDL No results found for: LDLCALC No results found for: TRIG No results found for: CHOLHDL No results found for: HGBA1C       Assessment & Plan:   Problem List Items Addressed This Visit      Unprioritized   Neuromuscular disorder  ([place])   Osteoarthritis, hip, bilateral    Per ortho         I am having Mr. Congrove maintain his ALPRAZolam, cyclobenzaprine, senna, niacin, oxyCODONE, sildenafil, gabapentin, baclofen, fish oil-omega-3 fatty acids, Cholecalciferol, Lactobacillus, Vitamin D (Ergocalciferol), docusate sodium, mineral oil, multivitamin with minerals, OVER THE COUNTER MEDICATION, [place], Garlic Oil, aspirin, diphenhydrAMINE, ferrous sulfate, and oxyCODONE.  No orders of the defined types were placed in this encounter.   CMA served as Education administrator during this visit. History, Physical and Plan performed by medical provider. Documentation and orders reviewed and attested to.  Ann Held, DO    Patient ID: Christopher Wheeler, male   DOB: January 19, 1946, 71 y.o.   MRN: [id number]

## 2016-11-21 NOTE — Patient Instructions (Signed)
Hip Pain The hip is the joint between the upper legs and the lower pelvis. The bones, cartilage, tendons, and muscles of your hip joint support your body and allow you to move around. Hip pain can range from a minor ache to severe pain in one or both of your hips. The pain may be felt on the inside of the hip joint near the groin, or the outside near the buttocks and upper thigh. You may also have swelling or stiffness. Follow these instructions at home: Managing pain, stiffness, and swelling   If directed, apply ice to the injured area.  Put ice in a plastic bag.  Place a towel between your skin and the bag.  Leave the ice on for 20 minutes, 2-3 times a day  Sleep with a pillow between your legs on your most comfortable side.  Avoid any activities that cause pain. General instructions   Take over-the-counter and prescription medicines only as told by your health care provider.  Do any exercises as told by your health care provider.  Record the following:  How often you have hip pain.  The location of your pain.  What the pain feels like.  What makes the pain worse.  Keep all follow-up visits as told by your health care provider. This is important. Contact a health care provider if:  You cannot put weight on your leg.  Your pain or swelling continues or gets worse after one week.  It gets harder to walk.  You have a fever. Get help right away if:  You fall.  You have a sudden increase in pain and swelling in your hip.  Your hip is red or swollen or very tender to touch. Summary  Hip pain can range from a minor ache to severe pain in one or both of your hips.  The pain may be felt on the inside of the hip joint near the groin, or the outside near the buttocks and upper thigh.  Avoid any activities that cause pain.  Record how often you have hip pain, the location of the pain, what makes it worse and what it feels like. This information is not intended to  replace advice given to you by your health care provider. Make sure you discuss any questions you have with your health care provider. Document Released: 01/02/2010 Document Revised: 06/17/2016 Document Reviewed: 06/17/2016 Elsevier Interactive Patient Education  2017 [employer] Inc[employer]  

## 2016-11-21 NOTE — Assessment & Plan Note (Signed)
Per ortho.  

## 2016-11-21 NOTE — Progress Notes (Signed)
Pre visit review using our clinic review tool, if applicable. No additional management support is needed unless otherwise documented below in the visit note. 

## 2016-12-17 ENCOUNTER — Telehealth: Payer: Self-pay

## 2016-12-17 NOTE — Telephone Encounter (Signed)
Pt sent in a letter requesting that his chart be updated with his most current allergies and medications.  He also requested that once chart is updated that a copy of the updates be sent to him.

## 2016-12-17 NOTE — Telephone Encounter (Signed)
Med list and allergies updated.  Updated lists printed and mailed to patient as requested.

## 2017-11-24 ENCOUNTER — Ambulatory Visit (INDEPENDENT_AMBULATORY_CARE_PROVIDER_SITE_OTHER): Payer: Medicare Other | Admitting: *Deleted

## 2017-11-24 ENCOUNTER — Encounter: Payer: Self-pay | Admitting: *Deleted

## 2017-11-24 ENCOUNTER — Ambulatory Visit (INDEPENDENT_AMBULATORY_CARE_PROVIDER_SITE_OTHER): Payer: Medicare Other | Admitting: Family Medicine

## 2017-11-24 ENCOUNTER — Encounter: Payer: Self-pay | Admitting: Family Medicine

## 2017-11-24 VITALS — BP 136/80 | HR 73 | Ht 76.0 in | Wt 211.0 lb

## 2017-11-24 DIAGNOSIS — I723 Aneurysm of iliac artery: Secondary | ICD-10-CM | POA: Insufficient documentation

## 2017-11-24 DIAGNOSIS — I2510 Atherosclerotic heart disease of native coronary artery without angina pectoris: Secondary | ICD-10-CM

## 2017-11-24 DIAGNOSIS — M160 Bilateral primary osteoarthritis of hip: Secondary | ICD-10-CM | POA: Diagnosis not present

## 2017-11-24 DIAGNOSIS — I700 Atherosclerosis of aorta: Secondary | ICD-10-CM | POA: Diagnosis not present

## 2017-11-24 DIAGNOSIS — C642 Malignant neoplasm of left kidney, except renal pelvis: Secondary | ICD-10-CM

## 2017-11-24 DIAGNOSIS — Z0000 Encounter for general adult medical examination without abnormal findings: Secondary | ICD-10-CM

## 2017-11-24 DIAGNOSIS — C649 Malignant neoplasm of unspecified kidney, except renal pelvis: Secondary | ICD-10-CM | POA: Insufficient documentation

## 2017-11-24 DIAGNOSIS — G8929 Other chronic pain: Secondary | ICD-10-CM | POA: Diagnosis not present

## 2017-11-24 NOTE — Patient Instructions (Signed)
Preventive Care 71 Years and Older, Male Preventive care refers to lifestyle choices and visits with your health care provider that can promote health and wellness. What does preventive care include?  A yearly physical exam. This is also called an annual well check.  Dental exams once or twice a year.  Routine eye exams. Ask your health care provider how often you should have your eyes checked.  Personal lifestyle choices, including: ? Daily care of your teeth and gums. ? Regular physical activity. ? Eating a healthy diet. ? Avoiding tobacco and drug use. ? Limiting alcohol use. ? Practicing safe sex. ? Taking low doses of aspirin every day. ? Taking vitamin and mineral supplements as recommended by your health care provider. What happens during an annual well check? The services and screenings done by your health care provider during your annual well check will depend on your age, overall health, lifestyle risk factors, and family history of disease. Counseling Your health care provider may ask you questions about your:  Alcohol use.  Tobacco use.  Drug use.  Emotional well-being.  Home and relationship well-being.  Sexual activity.  Eating habits.  History of falls.  Memory and ability to understand (cognition).  Work and work environment.  Screening You may have the following tests or measurements:  Height, weight, and BMI.  Blood pressure.  Lipid and cholesterol levels. These may be checked every 5 years, or more frequently if you are over 50 years old.  Skin check.  Lung cancer screening. You may have this screening every year starting at age 55 if you have a 30-pack-year history of smoking and currently smoke or have quit within the past 15 years.  Fecal occult blood test (FOBT) of the stool. You may have this test every year starting at age 50.  Flexible sigmoidoscopy or colonoscopy. You may have a sigmoidoscopy every 5 years or a colonoscopy every 10  years starting at age 50.  Prostate cancer screening. Recommendations will vary depending on your family history and other risks.  Hepatitis C blood test.  Hepatitis B blood test.  Sexually transmitted disease (STD) testing.  Diabetes screening. This is done by checking your blood sugar (glucose) after you have not eaten for a while (fasting). You may have this done every 1-3 years.  Abdominal aortic aneurysm (AAA) screening. You may need this if you are a current or former smoker.  Osteoporosis. You may be screened starting at age 70 if you are at high risk.  Talk with your health care provider about your test results, treatment options, and if necessary, the need for more tests. Vaccines Your health care provider may recommend certain vaccines, such as:  Influenza vaccine. This is recommended every year.  Tetanus, diphtheria, and acellular pertussis (Tdap, Td) vaccine. You may need a Td booster every 10 years.  Varicella vaccine. You may need this if you have not been vaccinated.  Zoster vaccine. You may need this after age 60.  Measles, mumps, and rubella (MMR) vaccine. You may need at least one dose of MMR if you were born in 1957 or later. You may also need a second dose.  Pneumococcal 13-valent conjugate (PCV13) vaccine. One dose is recommended after age 65.  Pneumococcal polysaccharide (PPSV23) vaccine. One dose is recommended after age 65.  Meningococcal vaccine. You may need this if you have certain conditions.  Hepatitis A vaccine. You may need this if you have certain conditions or if you travel or work in places where you   may be exposed to hepatitis A.  Hepatitis B vaccine. You may need this if you have certain conditions or if you travel or work in places where you may be exposed to hepatitis B.  Haemophilus influenzae type b (Hib) vaccine. You may need this if you have certain risk factors.  Talk to your health care provider about which screenings and vaccines  you need and how often you need them. This information is not intended to replace advice given to you by your health care provider. Make sure you discuss any questions you have with your health care provider. Document Released: 08/11/2015 Document Revised: 04/03/2016 Document Reviewed: 05/16/2015 Elsevier Interactive Patient Education  2018 [employer] Inc.  

## 2017-11-24 NOTE — Patient Instructions (Signed)
Follow up with PCP as directed  Continue to eat heart healthy diet (full of fruits, vegetables, whole grains, lean protein, water--limit salt, fat, and sugar intake) and increase physical activity as tolerated.  Continue doing brain stimulating activities (puzzles, reading, adult coloring books, staying active) to keep memory sharp.    Christopher Wheeler , Thank you for taking time to come for your Medicare Wellness Visit. I appreciate your ongoing commitment to your health goals. Please review the following plan we discussed and let me know if I can assist you in the future.   These are the goals we discussed: Goals    None      This is a list of the screening recommended for you and due dates:  Health Maintenance  Topic Date Due  .  Hepatitis C: One time screening is recommended by Center for Disease Control  (CDC) for  adults born from [birth year] through 1964.   [birth year]  . Colon Cancer Screening  08/12/2017  . Tetanus Vaccine  11/14/2025

## 2017-11-24 NOTE — Progress Notes (Signed)
Patient ID: Christopher Wheeler, male    DOB: 1945/08/30  Age: 72 y.o. MRN: [id number]    Subjective:  Subjective  HPI Christopher Wheeler presents for f/u -- he has brought a lot of paperwork from the [place] so we could update his records.  He has been very sad since his wife's death but is coping    He keeping himself very busy with church and church activities.  He states he needs no help at home. He recently had surgery for renal cell carcinoma  Records from [place] and [facility] reviewed  Review of Systems  Constitutional: Negative.  Negative for fatigue and unexpected weight change.  HENT: Negative for congestion, ear pain, hearing loss, nosebleeds, postnasal drip, rhinorrhea, sinus pressure, sneezing and tinnitus.   Eyes: Negative for photophobia, discharge, itching and visual disturbance.  Respiratory: Negative.  Negative for cough and shortness of breath.   Cardiovascular: Negative.  Negative for chest pain and palpitations.  Gastrointestinal: Negative for abdominal distention, abdominal pain, anal bleeding, blood in stool and constipation.  Endocrine: Negative.   Genitourinary: Negative.   Musculoskeletal: Negative.   Skin: Negative.   Allergic/Immunologic: Negative.   Neurological: Negative for dizziness, weakness, light-headedness, numbness and headaches.  Psychiatric/Behavioral: Negative for agitation, confusion, decreased concentration, dysphoric mood, sleep disturbance and suicidal ideas. The patient is not nervous/anxious.     History Past Medical History:  Diagnosis Date  . Arthritis    osteoarthritis-hips  . Chronic pain disorder 07-09-12   under chronic pain management through [place]   . 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   His family history  includes Diabetes in his sister; Diabetes Mellitus II in his paternal grandmother.He reports that he quit smoking about 17 years ago. His smoking use included cigarettes and pipe. He has never used smokeless tobacco. He reports that he does not drink alcohol or use drugs.  Current Outpatient Medications on File Prior to Visit  Medication Sig Dispense Refill  . alendronate (FOSAMAX) 70 MG tablet Take 70 mg by mouth once a week. Take with a full glass of water on an empty stomach.    Marland Kitchen aspirin 325 MG tablet Take 325 mg by mouth daily.    . B Complex-C (SUPER B COMPLEX PO) Take 1 tablet by mouth daily.    . baclofen (LIORESAL) 10 MG tablet Take 10 mg by mouth 4 (four) times daily.     . calcium carbonate (TUMS - DOSED IN MG ELEMENTAL CALCIUM) 500 MG chewable tablet Chew 1 tablet by mouth 2 (two) times daily.    . clindamycin (CLEOCIN) 300 MG capsule Take 600 mg by mouth as needed (Prior to Dental Work).    . diclofenac sodium (VOLTAREN) 1 % GEL Apply 2 g topically as needed.    . diphenhydrAMINE (BENADRYL) 25 mg capsule Take 50 mg by mouth at bedtime as needed for sleep.    Marland Kitchen docusate sodium (COLACE) 100 MG capsule Take 100 mg by mouth 3 (three) times daily. Patient takes this reguarly    . DULoxetine (CYMBALTA) 30 MG capsule Take 30 mg by mouth daily.    . fish oil-omega-3 fatty acids 1000 MG capsule Take 2 g by mouth 3 (three) times daily.    Marland Kitchen gabapentin (NEURONTIN) 300 MG capsule Take 600 mg by mouth 3 (  three) times daily.    . Garlic Oil 7672 MG CAPS Take 1,000 mg by mouth daily.     Marland Kitchen ketoconazole (NIZORAL) 2 % cream Apply 1 application topically as needed for irritation. Apply to affected area.    Marland Kitchen ketoconazole (NIZORAL) 2 % shampoo Apply 1 application topically 3 (three) times a week.    . Lactobacillus CHEW Chew 2 tablets by mouth 2 (two) times daily.    . metoprolol tartrate (LOPRESSOR) 25 MG tablet Take 12.5 mg by mouth 2 (two) times daily.     . Multiple Vitamin (MULTIVITAMIN WITH  MINERALS) TABS Take 1 tablet by mouth daily.    Marland Kitchen OVER THE COUNTER MEDICATION Take 1,000 capsules by mouth 3 (three) times daily. Methyl-sulfonyl-methane    . oxyCODONE (OXYCONTIN) 20 MG 12 hr tablet Take 20 mg by mouth 3 (three) times daily.    Marland Kitchen POLYETHYLENE GLYCOL 3350 PO Take 25 mg by mouth at bedtime.    . pravastatin (PRAVACHOL) 20 MG tablet Take 20 mg by mouth at bedtime as needed and may repeat dose one time if needed.     . senna (SENOKOT) 8.6 MG tablet Take 4 tablets by mouth 2 (two) times daily. Patient takes this regularly    . sildenafil (VIAGRA) 50 MG tablet Take 50 mg by mouth as needed.     . St Johns Wort 300 MG CAPS Take 3 capsules by mouth daily.    Cristino Martes CAPS Take 3 capsules by mouth daily. 1605 mg capsules    . Vitamin D, Cholecalciferol, 1000 units CAPS Take 1,000 Units by mouth 5 (five) times daily.     No current facility-administered medications on file prior to visit.      Objective:  Objective  Physical Exam  Constitutional: He is oriented to person, place, and time. Vital signs are normal. He appears well-developed and well-nourished. He is sleeping.  HENT:  Head: Normocephalic and atraumatic.  Mouth/Throat: Oropharynx is clear and moist.  Eyes: Pupils are equal, round, and reactive to light. EOM are normal.  Neck: Normal range of motion. Neck supple. No thyromegaly present.  Cardiovascular: Normal rate and regular rhythm.  No murmur heard. Pulmonary/Chest: Effort normal and breath sounds normal. No respiratory distress. He has no wheezes. He has no rales. He exhibits no tenderness.  Musculoskeletal: He exhibits no edema or tenderness.  Pt walks with walker  Neurological: He is alert and oriented to person, place, and time.  Skin: Skin is warm and dry.  Psychiatric: He has a normal mood and affect. His behavior is normal. Judgment and thought content normal.  Nursing note and vitals reviewed.  BP 136/80 (BP Location: Right Arm, Cuff Size: Normal)    Pulse 73   Temp 98.2 F (36.8 C) (Oral)   Resp 16   Ht 6\' 4"  (1.93 m)   Wt 211 lb 12.8 oz (96.1 kg)   SpO2 98%   BMI 25.78 kg/m  Wt Readings from Last 3 Encounters:  11/24/17 211 lb (95.7 kg)  11/24/17 211 lb 12.8 oz (96.1 kg)  11/21/16 212 lb 3.2 oz (96.3 kg)     Lab Results  Component Value Date   WBC 7.3 07/22/2012   HGB 11.3 (L) 07/22/2012   HCT 33.6 (L) 07/22/2012   PLT 149 (L) 07/22/2012   GLUCOSE 113 (H) 07/22/2012   NA 138 07/22/2012   K 3.9 07/22/2012   CL 104 07/22/2012   CREATININE 0.90 07/22/2012   BUN 13 07/22/2012   CO2  26 07/22/2012   INR 1.07 07/09/2012    No results found.   Assessment & Plan:  Plan  I have discontinued Shanon Brow L. Whitelock's mineral oil. I am also having him maintain his senna, oxyCODONE, sildenafil, gabapentin, baclofen, fish oil-omega-3 fatty acids, Lactobacillus, docusate sodium, multivitamin with minerals, OVER THE COUNTER MEDICATION, [place], Garlic Oil, aspirin, Vitamin D (Cholecalciferol), diphenhydrAMINE, alendronate, calcium carbonate, diclofenac sodium, ketoconazole, B Complex-C (SUPER B COMPLEX PO), St Johns Wort, clindamycin, ketoconazole, DULoxetine, metoprolol tartrate, pravastatin, and POLYETHYLENE GLYCOL 3350 PO.  No orders of the defined types were placed in this encounter.   Problem List Items Addressed This Visit      Unprioritized   Aneurysm, common iliac artery ([place])    Per vascular at [facility]      Relevant Medications   metoprolol tartrate (LOPRESSOR) 25 MG tablet   pravastatin (PRAVACHOL) 20 MG tablet   Atherosclerosis of aorta (HCC)   Relevant Medications   metoprolol tartrate (LOPRESSOR) 25 MG tablet   pravastatin (PRAVACHOL) 20 MG tablet   CAD (coronary artery disease)    Per cardiology at [facility] On low dose pravastatin due to intolerance of statins Pt has labs done at [place] was brought in and reviewed      Relevant Medications   metoprolol tartrate (LOPRESSOR) 25 MG tablet   pravastatin (PRAVACHOL) 20 MG  tablet   Chronic pain    [facility]  Vs cervical radiculopathy --- pt declines any further intervention       Relevant Medications   DULoxetine (CYMBALTA) 30 MG capsule   Osteoarthritis, hip, bilateral    Per ortho-- DR Alvan Dame and pain management at [facility]      Renal cell carcinoma ([place])    [facility]       pt in office >45 min with greater than 50% time face to face discussing his medical records and reviewing records brought in from [place] and records from [place]  Follow-up: Return in about 1 year (around 11/25/2018), or if symptoms worsen or fail to improve, for annual exam, fasting.  Ann Held, DO

## 2017-11-24 NOTE — Progress Notes (Signed)
Subjective:   Christopher Wheeler is a 72 y.o. male who presents for an Initial Medicare Annual Wellness Visit.  Review of Systems No ROS.  Medicare Wellness Visit. Additional risk factors are reflected in the social history.    Sleep patterns: Sleeps 8 hrs. Feels rested.  Home Safety/Smoke Alarms: Feels safe in home. Smoke alarms in place.  Living environment; residence and Firearm Safety: Lives alone. Uses shower stool.No stairs.  Male:   CCS- pt states he will schedule in June 2019. PSA- No results found for: PSA     Objective:    Today's Vitals   11/24/17 1440  BP: 136/80  Pulse: 73  SpO2: 98%  Weight: 211 lb (95.7 kg)  Height: 6\' 4"  (1.93 m)   Body mass index is 25.68 kg/m.  Advanced Directives 11/24/2017 07/21/2012 07/09/2012  Does Patient Have a Medical Advance Directive? No Patient does not have advance directive Patient does not have advance directive  Would patient like information on creating a medical advance directive? No - Patient declined - -  Pre-existing out of facility DNR order (yellow form or pink MOST form) - No No    Current Medications (verified) Outpatient Encounter Medications as of 11/24/2017  Medication Sig  . alendronate (FOSAMAX) 70 MG tablet Take 70 mg by mouth once a week. Take with a full glass of water on an empty stomach.  Marland Kitchen aspirin 325 MG tablet Take 325 mg by mouth daily.  . B Complex-C (SUPER B COMPLEX PO) Take 1 tablet by mouth daily.  . baclofen (LIORESAL) 10 MG tablet Take 10 mg by mouth 4 (four) times daily.   . calcium carbonate (TUMS - DOSED IN MG ELEMENTAL CALCIUM) 500 MG chewable tablet Chew 1 tablet by mouth 2 (two) times daily.  . clindamycin (CLEOCIN) 300 MG capsule Take 600 mg by mouth as needed (Prior to Dental Work).  . diclofenac sodium (VOLTAREN) 1 % GEL Apply 2 g topically as needed.  . diphenhydrAMINE (BENADRYL) 25 mg capsule Take 50 mg by mouth at bedtime as needed for sleep.  Marland Kitchen docusate sodium (COLACE) 100 MG capsule  Take 100 mg by mouth 3 (three) times daily. Patient takes this reguarly  . DULoxetine (CYMBALTA) 30 MG capsule Take 30 mg by mouth daily.  . fish oil-omega-3 fatty acids 1000 MG capsule Take 2 g by mouth 3 (three) times daily.  Marland Kitchen gabapentin (NEURONTIN) 300 MG capsule Take 600 mg by mouth 3 (three) times daily.  . Garlic Oil 4401 MG CAPS Take 1,000 mg by mouth daily.   Marland Kitchen ketoconazole (NIZORAL) 2 % cream Apply 1 application topically as needed for irritation. Apply to affected area.  Marland Kitchen ketoconazole (NIZORAL) 2 % shampoo Apply 1 application topically 3 (three) times a week.  . Lactobacillus CHEW Chew 2 tablets by mouth 2 (two) times daily.  . metoprolol tartrate (LOPRESSOR) 25 MG tablet Take 12.5 mg by mouth 2 (two) times daily.   . Multiple Vitamin (MULTIVITAMIN WITH MINERALS) TABS Take 1 tablet by mouth daily.  Marland Kitchen OVER THE COUNTER MEDICATION Take 1,000 capsules by mouth 3 (three) times daily. Methyl-sulfonyl-methane  . oxyCODONE (OXYCONTIN) 20 MG 12 hr tablet Take 20 mg by mouth 3 (three) times daily.  Marland Kitchen POLYETHYLENE GLYCOL 3350 PO Take 25 mg by mouth at bedtime.  . pravastatin (PRAVACHOL) 20 MG tablet Take 20 mg by mouth at bedtime as needed and may repeat dose one time if needed.   . senna (SENOKOT) 8.6 MG tablet Take 4 tablets  by mouth 2 (two) times daily. Patient takes this regularly  . sildenafil (VIAGRA) 50 MG tablet Take 50 mg by mouth as needed.   . St Johns Wort 300 MG CAPS Take 3 capsules by mouth daily.  Cristino Martes CAPS Take 3 capsules by mouth daily. 1605 mg capsules  . Vitamin D, Cholecalciferol, 1000 units CAPS Take 1,000 Units by mouth 5 (five) times daily.  . [DISCONTINUED] mineral oil liquid Take 45 mLs by mouth 3 (three) times daily. Patient takes this regularly   No facility-administered encounter medications on file as of 11/24/2017.     Allergies (verified) Levofloxacin; Methadone; Morphine and related; Other; Simvastatin; Temazepam; Doxycycline; Erythromycin; and  Penicillins   History: Past Medical History:  Diagnosis Date  . Arthritis    osteoarthritis-hips  . Chronic pain disorder 07-09-12   under chronic pain management through [place]   . 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 COLONOSCOPY W/ POLYPECTOMY  [facility]  . TOTAL HIP ARTHROPLASTY  07/21/2012   Procedure: TOTAL HIP ARTHROPLASTY ANTERIOR APPROACH;  Surgeon: Mauri Pole, MD;  Location: [facility];  Service: Orthopedics;  Laterality: Left;   Family History  Problem Relation Age of Onset  . Diabetes Sister   . Diabetes Mellitus II Paternal Grandmother    Social History   Socioeconomic History  . Marital status: Married    Spouse name: Not on file  . Number of children: Not on file  . Years of education: Not on file  . Highest education level: Not on file  Occupational History  . Not on file  Social Needs  . Financial resource strain: Not on file  . Food insecurity:    Worry: Not on file    Inability: Not on file  . Transportation needs:    Medical: Not on file    Non-medical: Not on file  Tobacco Use  . Smoking status: Former Smoker    Types: Cigarettes, Pipe    Last attempt to quit: 07/09/2000    Years since quitting: 17.3  . Smokeless tobacco: Never Used  Substance and Sexual Activity  . Alcohol use: No    Comment: 1'2007-none since pain management tx.  . Drug use: No  . Sexual activity: Yes  Lifestyle  . Physical activity:    Days per week: Not on file    Minutes per session: Not on file  . Stress: Not on file  Relationships  . Social connections:    Talks on phone: Not on file    Gets together: Not on file    Attends religious service: Not on file    Active member of club or organization: Not on file    Attends meetings of clubs or organizations: Not on file    Relationship  status: Not on file  Other Topics Concern  . Not on file  Social History Narrative  . Not on file   Tobacco Counseling Counseling given: Not Answered   Clinical Intake: Pain : No/denies pain    Activities of Daily Living In your present state of health, do you have any difficulty performing the following activities: 11/24/2017  Hearing? N  Comment wears hearing aids.  Vision? N  Difficulty concentrating or making decisions? N  Walking or climbing stairs? N  Dressing or bathing? N  Doing errands, shopping? N  Preparing Food  and eating ? N  Using the Toilet? N  In the past six months, have you accidently leaked urine? N  Do you have problems with loss of bowel control? N  Managing your Medications? N  Managing your Finances? N  Housekeeping or managing your Housekeeping? N  Some recent data might be hidden     Immunizations and Health Maintenance  There is no immunization history on file for this patient. Health Maintenance Due  Topic Date Due  . Hepatitis C Screening  [birth year]  . COLONOSCOPY  08/12/2017    Patient Care Team: Carollee Herter, Alferd Apa, DO as PCP - General (Family Medicine) Marica Otter, OD (Optometry)  Indicate any recent Medical Services you may have received from other than [facility] providers in the past year (date may be approximate).    Assessment:   This is a routine wellness examination for Christopher Wheeler. Physical assessment deferred to PCP.  Hearing/Vision screen  Visual Acuity Screening   Right eye Left eye Both eyes  Without correction:     With correction: 20/25 20/25 20/25   Hearing Screening Comments: Passed whisper test with hearing aids in   Dietary issues and exercise activities discussed:Diet (meal preparation, eat out, water intake, caffeinated beverages, dairy products, fruits and vegetables): in general, a "healthy" diet  , well balanced        Goals    None     Depression Screen PHQ 2/9 Scores 11/21/2016  PHQ - 2 Score 0      Fall Risk Fall Risk  11/24/2017 11/21/2016  Falls in the past year? No No    Cognitive Function: Ad8 score reviewed for issues:  Issues making decisions:no  Less interest in hobbies / activities:  Repeats questions, stories (family complaining):no  Trouble using ordinary gadgets (microwave, computer, phone):no  Forgets the month or year: no  Mismanaging finances: no  Remembering appts:no  Daily problems with thinking and/or memory:no Ad8 score is=0         Screening Tests Health Maintenance  Topic Date Due  . Hepatitis C Screening  1946/07/07  . COLONOSCOPY  08/12/2017  . TETANUS/TDAP  11/14/2025       Plan:   Follow up with PCP as directed  Continue to eat heart healthy diet (full of fruits, vegetables, whole grains, lean protein, water--limit salt, fat, and sugar intake) and increase physical activity as tolerated.  Continue doing brain stimulating activities (puzzles, reading, adult coloring books, staying active) to keep memory sharp.    I have personally reviewed and noted the following in the patient's chart:   . Medical and social history . Use of alcohol, tobacco or illicit drugs  . Current medications and supplements . Functional ability and status . Nutritional status . Physical activity . Advanced directives . List of other physicians . Hospitalizations, surgeries, and ER visits in previous 12 months . Vitals . Screenings to include cognitive, depression, and falls . Referrals and appointments  In addition, I have reviewed and discussed with patient certain preventive protocols, quality metrics, and best practice recommendations. A written personalized care plan for preventive services as well as general preventive health recommendations were provided to patient.     [employer], [place]   11/24/2017

## 2017-11-26 DIAGNOSIS — G8929 Other chronic pain: Secondary | ICD-10-CM | POA: Insufficient documentation

## 2017-11-26 DIAGNOSIS — I2510 Atherosclerotic heart disease of native coronary artery without angina pectoris: Secondary | ICD-10-CM | POA: Insufficient documentation

## 2017-11-26 NOTE — Assessment & Plan Note (Signed)
Per vascular at [facility]

## 2017-11-26 NOTE — Assessment & Plan Note (Signed)
Per ortho-- DR Alvan Dame and pain management at [facility]

## 2017-11-26 NOTE — Assessment & Plan Note (Signed)
[facility]

## 2017-11-26 NOTE — Assessment & Plan Note (Signed)
[facility]  Vs cervical radiculopathy --- pt declines any further intervention

## 2017-11-26 NOTE — Assessment & Plan Note (Signed)
Per cardiology at [facility] On low dose pravastatin due to intolerance of statins Pt has labs done at [place] was brought in and reviewed

## 2018-07-16 DIAGNOSIS — H52223 Regular astigmatism, bilateral: Secondary | ICD-10-CM | POA: Diagnosis not present

## 2018-07-16 DIAGNOSIS — H40019 Open angle with borderline findings, low risk, unspecified eye: Secondary | ICD-10-CM | POA: Diagnosis not present

## 2018-07-16 DIAGNOSIS — H524 Presbyopia: Secondary | ICD-10-CM | POA: Diagnosis not present

## 2018-07-16 DIAGNOSIS — H5213 Myopia, bilateral: Secondary | ICD-10-CM | POA: Diagnosis not present

## 2018-07-16 DIAGNOSIS — H25813 Combined forms of age-related cataract, bilateral: Secondary | ICD-10-CM | POA: Diagnosis not present

## 2018-07-16 DIAGNOSIS — H04123 Dry eye syndrome of bilateral lacrimal glands: Secondary | ICD-10-CM | POA: Diagnosis not present

## 2018-07-16 DIAGNOSIS — H43813 Vitreous degeneration, bilateral: Secondary | ICD-10-CM | POA: Diagnosis not present

## 2018-11-26 ENCOUNTER — Ambulatory Visit: Payer: Medicare Other | Admitting: Family Medicine

## 2018-11-26 ENCOUNTER — Ambulatory Visit: Payer: Medicare Other | Admitting: *Deleted

## 2019-01-14 DIAGNOSIS — H52223 Regular astigmatism, bilateral: Secondary | ICD-10-CM | POA: Diagnosis not present

## 2019-01-14 DIAGNOSIS — H40053 Ocular hypertension, bilateral: Secondary | ICD-10-CM | POA: Diagnosis not present

## 2019-01-14 DIAGNOSIS — H40013 Open angle with borderline findings, low risk, bilateral: Secondary | ICD-10-CM | POA: Diagnosis not present

## 2019-01-14 DIAGNOSIS — H524 Presbyopia: Secondary | ICD-10-CM | POA: Diagnosis not present

## 2019-01-14 DIAGNOSIS — H5213 Myopia, bilateral: Secondary | ICD-10-CM | POA: Diagnosis not present

## 2019-01-21 ENCOUNTER — Ambulatory Visit: Payer: Medicare Other | Admitting: *Deleted

## 2019-02-05 NOTE — Progress Notes (Addendum)
Subjective:   Christopher Wheeler is a 73 y.o. male who presents for Medicare Annual/Subsequent preventive examination.  Review of Systems: No ROS.   See social history for additional risk factors. Cardiac Risk Factors include: advanced age (>2men, >41 women) Sleep patterns: no issues. Home Safety/Smoke Alarms: Feels safe in home. Smoke alarms in place.  Lives alone. Uses shower stool. No stairs.   Male:   [place]- Scheduled 2022.    PSA- No results found for: PSA      Objective:    Vitals: BP (!) 108/57 (BP Location: Left Arm, Patient Position: Sitting, Cuff Size: Normal) Comment: all vitals done by Shea Evans CMA  Pulse 61   Temp 98.4 F (36.9 C) (Oral)   Resp 18   Ht 6\' 4"  (1.93 m)   Wt 209 lb (94.8 kg)   SpO2 98%   BMI 25.44 kg/m   Body mass index is 25.44 kg/m.  Advanced Directives 02/08/2019 02/08/2019 11/24/2017 07/21/2012 07/09/2012  Does Patient Have a Medical Advance Directive? Yes No No Patient does not have advance directive Patient does not have advance directive  Type of Advance Directive [place];Living will - - - -  Does patient want to make changes to medical advance directive? No - Patient declined - - - -  Copy of [place] in Chart? No - copy requested - - - -  Would patient like information on creating a medical advance directive? - No - Patient declined No - Patient declined - -  Pre-existing out of facility DNR order (yellow form or pink MOST form) - - - No No    Tobacco Social History   Tobacco Use  Smoking Status Former Smoker  . Types: Cigarettes, Pipe  . Quit date: 07/09/2000  . Years since quitting: 18.5  Smokeless Tobacco Never Used     Counseling given: Not Answered   Clinical Intake: Pain : No/denies pain     Past Medical History:  Diagnosis Date  . Arthritis    osteoarthritis-hips  . Chronic pain disorder 07-09-12   under chronic pain management through [place]   . 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 COLONOSCOPY W/ POLYPECTOMY  2016   [facility]  . TOTAL HIP ARTHROPLASTY  07/21/2012   Procedure: TOTAL HIP ARTHROPLASTY ANTERIOR APPROACH;  Surgeon: Mauri Pole, MD;  Location: [facility];  Service: Orthopedics;  Laterality: Left;   Family History  Problem Relation Age of Onset  . Diabetes Sister   . Diabetes Mellitus II Paternal Grandmother    Social History   Socioeconomic History  . Marital status: Married    Spouse name: Not on file  . Number of children: Not on file  . Years of education: Not on file  . Highest education level: Not on file  Occupational History  . Not on file  Social Needs  . Financial resource strain: Not on file  . Food insecurity    Worry: Not on file    Inability: Not on file  . Transportation needs    Medical: Not on file    Non-medical: Not on file  Tobacco Use  . Smoking status: Former Smoker    Types: Cigarettes, Pipe    Quit date: 07/09/2000    Years since quitting: 18.5  . Smokeless tobacco: Never Used  Substance and  Sexual Activity  . Alcohol use: No    Comment: 1'2007-none since pain management tx.  . Drug use: No  . Sexual activity: Yes  Lifestyle  . Physical activity    Days per week: Not on file    Minutes per session: Not on file  . Stress: Not on file  Relationships  . Social Herbalist on phone: Not on file    Gets together: Not on file    Attends religious service: Not on file    Active member of club or organization: Not on file    Attends meetings of clubs or organizations: Not on file    Relationship status: Not on file  Other Topics Concern  . Not on file  Social History Narrative  . Not on file    Outpatient Encounter Medications as of 02/08/2019  Medication Sig  . alendronate (FOSAMAX) 70 MG tablet Take 70 mg by mouth  once a week. Take with a full glass of water on an empty stomach.  . B Complex-C (SUPER B COMPLEX PO) Take 1 tablet by mouth daily.  . calcium carbonate (TUMS - DOSED IN MG ELEMENTAL CALCIUM) 500 MG chewable tablet Chew 1 tablet by mouth 2 (two) times daily.  . clindamycin (CLEOCIN) 300 MG capsule Take 600 mg by mouth as needed (Prior to Dental Work).  . diphenhydrAMINE (BENADRYL) 25 mg capsule Take 50 mg by mouth at bedtime as needed for sleep.  Marland Kitchen docusate sodium (COLACE) 100 MG capsule Take 100 mg by mouth 3 (three) times daily. Patient takes this reguarly  . fish oil-omega-3 fatty acids 1000 MG capsule Take 2 g by mouth 3 (three) times daily.  Marland Kitchen gabapentin (NEURONTIN) 300 MG capsule Take 600 mg by mouth 3 (three) times daily.  . Garlic Oil 6269 MG CAPS Take 1,000 mg by mouth daily.   Marland Kitchen ketoconazole (NIZORAL) 2 % cream Apply 1 application topically as needed for irritation. Apply to affected area.  Marland Kitchen ketoconazole (NIZORAL) 2 % shampoo Apply 1 application topically 3 (three) times a week.  . Lactobacillus CHEW Chew 2 tablets by mouth 2 (two) times daily.  . metoprolol tartrate (LOPRESSOR) 25 MG tablet Take 12.5 mg by mouth 2 (two) times daily.   . Multiple Vitamin (MULTIVITAMIN WITH MINERALS) TABS Take 1 tablet by mouth daily.  Marland Kitchen OVER THE COUNTER MEDICATION Take 1,000 capsules by mouth 3 (three) times daily. Methyl-sulfonyl-methane  . POLYETHYLENE GLYCOL 3350 PO Take 25 mg by mouth at bedtime.  . senna (SENOKOT) 8.6 MG tablet Take 4 tablets by mouth 2 (two) times daily. Patient takes this regularly  . sildenafil (VIAGRA) 50 MG tablet Take 50 mg by mouth as needed.   Cristino Martes CAPS Take 3 capsules by mouth daily. 1605 mg capsules  . Vitamin D, Cholecalciferol, 1000 units CAPS Take 1,000 Units by mouth 5 (five) times daily.  . [DISCONTINUED] aspirin 325 MG tablet Take 325 mg by mouth daily.  . [DISCONTINUED] baclofen (LIORESAL) 10 MG tablet Take 10 mg by mouth 4 (four) times daily.   .  [DISCONTINUED] diclofenac sodium (VOLTAREN) 1 % GEL Apply 2 g topically as needed.  . [DISCONTINUED] DULoxetine (CYMBALTA) 30 MG capsule Take 30 mg by mouth daily.  . [DISCONTINUED] oxyCODONE (OXYCONTIN) 20 MG 12 hr tablet Take 20 mg by mouth 3 (three) times daily.  . [DISCONTINUED] pravastatin (PRAVACHOL) 20 MG tablet Take 20 mg by mouth at bedtime as needed and may repeat dose one time if needed.   . [  DISCONTINUED] [place] Wort 300 MG CAPS Take 3 capsules by mouth daily.   No facility-administered encounter medications on file as of 02/08/2019.     Activities of Daily Living In your present state of health, do you have any difficulty performing the following activities: 02/08/2019  Hearing? N  Vision? N  Difficulty concentrating or making decisions? N  Walking or climbing stairs? N  Dressing or bathing? N  Doing errands, shopping? N  Preparing Food and eating ? N  Using the Toilet? N  In the past six months, have you accidently leaked urine? N  Do you have problems with loss of bowel control? N  Managing your Medications? N  Managing your Finances? N  Housekeeping or managing your Housekeeping? N  Some recent data might be hidden    Patient Care Team: Carollee Herter, Alferd Apa, DO as PCP - General (Family Medicine) Marica Otter, [facility]) [place] as Referring Physician (General Practice)   Assessment:   This is a routine wellness examination for [place]. Physical assessment deferred to PCP.  Exercise Activities and Dietary recommendations Current Exercise Habits: The patient does not participate in regular exercise at present, Exercise limited by: None identified Diet (meal preparation, eat out, water intake, caffeinated beverages, dairy products, fruits and vegetables): well balanced, on average, 2 meals per day    Goals    . Maintain current health       Fall Risk Fall Risk  02/08/2019 11/24/2017 11/21/2016  Falls in the past year? 0 No No    Depression  Screen PHQ 2/9 Scores 02/08/2019 11/21/2016  PHQ - 2 Score 0 0    Cognitive Function Ad8 score reviewed for issues:  Issues making decisions:no  Less interest in hobbies / activities:no  Repeats questions, stories (family complaining):no  Trouble using ordinary gadgets (microwave, computer, phone):no  Forgets the month or year: no  Mismanaging finances: no  Remembering appts:no  Daily problems with thinking and/or memory:no Ad8 score is=0          There is no immunization history on file for this patient.  Screening Tests Health Maintenance  Topic Date Due  . Hepatitis C Screening  07-Mar-1946  . COLONOSCOPY  08/12/2017  . TETANUS/TDAP  11/14/2025         Plan:   See you next year!  Continue to eat heart healthy diet (full of fruits, vegetables, whole grains, lean protein, water--limit salt, fat, and sugar intake) and increase physical activity as tolerated.  Continue doing brain stimulating activities (puzzles, reading, adult coloring books, staying active) to keep memory sharp.   Bring a copy of your living will and/or healthcare power of attorney to your next office visit.   I have personally reviewed and noted the following in the patient's chart:   . Medical and social history . Use of alcohol, tobacco or illicit drugs  . Current medications and supplements . Functional ability and status . Nutritional status . Physical activity . Advanced directives . List of other physicians . Hospitalizations, surgeries, and ER visits in previous 12 months . Vitals . Screenings to include cognitive, depression, and falls . Referrals and appointments  In addition, I have reviewed and discussed with patient certain preventive protocols, quality metrics, and best practice recommendations. A written personalized care plan for preventive services as well as general preventive health recommendations were provided to patient.     Shela Nevin, [place]   02/08/2019

## 2019-02-08 ENCOUNTER — Ambulatory Visit (INDEPENDENT_AMBULATORY_CARE_PROVIDER_SITE_OTHER): Payer: Medicare Other | Admitting: *Deleted

## 2019-02-08 ENCOUNTER — Other Ambulatory Visit: Payer: Self-pay

## 2019-02-08 ENCOUNTER — Ambulatory Visit (INDEPENDENT_AMBULATORY_CARE_PROVIDER_SITE_OTHER): Payer: Medicare Other | Admitting: Family Medicine

## 2019-02-08 ENCOUNTER — Encounter: Payer: Self-pay | Admitting: Family Medicine

## 2019-02-08 ENCOUNTER — Encounter: Payer: Self-pay | Admitting: *Deleted

## 2019-02-08 VITALS — BP 108/57 | HR 61 | Temp 98.4°F | Resp 18 | Ht 76.0 in | Wt 209.4 lb

## 2019-02-08 VITALS — BP 108/57 | HR 61 | Temp 98.4°F | Resp 18 | Ht 76.0 in | Wt 209.0 lb

## 2019-02-08 DIAGNOSIS — Z0000 Encounter for general adult medical examination without abnormal findings: Secondary | ICD-10-CM

## 2019-02-08 DIAGNOSIS — G709 Myoneural disorder, unspecified: Secondary | ICD-10-CM | POA: Diagnosis not present

## 2019-02-08 DIAGNOSIS — G8929 Other chronic pain: Secondary | ICD-10-CM

## 2019-02-08 DIAGNOSIS — M8580 Other specified disorders of bone density and structure, unspecified site: Secondary | ICD-10-CM | POA: Insufficient documentation

## 2019-02-08 DIAGNOSIS — I723 Aneurysm of iliac artery: Secondary | ICD-10-CM | POA: Diagnosis not present

## 2019-02-08 DIAGNOSIS — I2510 Atherosclerotic heart disease of native coronary artery without angina pectoris: Secondary | ICD-10-CM

## 2019-02-08 DIAGNOSIS — Z87891 Personal history of nicotine dependence: Secondary | ICD-10-CM | POA: Diagnosis not present

## 2019-02-08 DIAGNOSIS — M8589 Other specified disorders of bone density and structure, multiple sites: Secondary | ICD-10-CM

## 2019-02-08 DIAGNOSIS — C642 Malignant neoplasm of left kidney, except renal pelvis: Secondary | ICD-10-CM

## 2019-02-08 NOTE — Progress Notes (Signed)
Patient ID: Christopher Wheeler, male    DOB: 05/06/1946  Age: 73 y.o. MRN: [id number]    Subjective:  Subjective  HPI Christopher Wheeler presents for f/u -- [facility] [place] [facility]   [facility] which has [facility]   They have dec his oxycodone from 60 to 30 mg and duloxetine 20 mg daily   Review of Systems  Constitutional: Negative for appetite change, diaphoresis, fatigue and unexpected weight change.  Eyes: Negative for pain, redness and visual disturbance.  Respiratory: Negative for cough, chest tightness, shortness of breath and wheezing.   Cardiovascular: Negative for chest pain, palpitations and leg swelling.  Endocrine: Negative for cold intolerance, heat intolerance, polydipsia, polyphagia and polyuria.  Genitourinary: Negative for difficulty urinating, dysuria and frequency.  Musculoskeletal: Positive for arthralgias and gait problem.       Walks with walker  Neurological: Negative for dizziness, light-headedness, numbness and headaches.    History Past Medical History:  Diagnosis Date  . Arthritis    osteoarthritis-hips  . Chronic pain disorder 07-09-12   under chronic pain management through [place]   . 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   His family history includes Diabetes in his sister; Diabetes Mellitus II in his paternal grandmother.He reports that he quit smoking about 18 years ago. His smoking use included cigarettes and pipe. He has never used smokeless tobacco. He reports that he does not drink alcohol or use drugs.  Current Outpatient Medications on File Prior to Visit  Medication Sig Dispense Refill  . alendronate (FOSAMAX) 70 MG tablet Take 70 mg by  mouth once a week. Take with a full glass of water on an empty stomach.    Marland Kitchen aspirin EC 81 MG tablet Take 81 mg by mouth daily.    Marland Kitchen atorvastatin (LIPITOR) 40 MG tablet Take 40 mg by mouth daily.    . B Complex-C (SUPER B COMPLEX PO) Take 1 tablet by mouth daily.    . calcium carbonate (TUMS - DOSED IN MG ELEMENTAL CALCIUM) 500 MG chewable tablet Chew 1 tablet by mouth 2 (two) times daily.    . clindamycin (CLEOCIN) 300 MG capsule Take 600 mg by mouth as needed (Prior to Dental Work).    . diphenhydrAMINE (BENADRYL) 25 mg capsule Take 50 mg by mouth at bedtime as needed for sleep.    Marland Kitchen docusate sodium (COLACE) 100 MG capsule Take 100 mg by mouth 3 (three) times daily. Patient takes this reguarly    . DULoxetine (CYMBALTA) 20 MG capsule Take 20 mg by mouth daily.    . fish oil-omega-3 fatty acids 1000 MG capsule Take 2 g by mouth 3 (three) times daily.    Marland Kitchen gabapentin (NEURONTIN) 300 MG capsule Take 600 mg by mouth 3 (three) times daily.    . Garlic Oil 6010 MG CAPS Take 1,000 mg by mouth daily.     Marland Kitchen ketoconazole (NIZORAL) 2 % cream Apply 1 application topically as needed for irritation. Apply to affected area.    Marland Kitchen ketoconazole (NIZORAL) 2 % shampoo Apply 1 application topically 3 (three) times a week.    . Lactobacillus CHEW Chew 2 tablets by mouth 2 (two) times daily.    Marland Kitchen  Methylsulfonylmethane 1000 MG CAPS Take 1,000 mg by mouth. Take 3 caps daily    . metoprolol tartrate (LOPRESSOR) 25 MG tablet Take 12.5 mg by mouth 2 (two) times daily.     . Multiple Vitamin (MULTIVITAMIN WITH MINERALS) TABS Take 1 tablet by mouth daily.    Marland Kitchen OVER THE COUNTER MEDICATION Take 1,000 capsules by mouth 3 (three) times daily. Methyl-sulfonyl-methane    . Oxycodone HCl 10 MG TABS Take 10 mg by mouth.    Marland Kitchen POLYETHYLENE GLYCOL 3350 PO Take 25 mg by mouth at bedtime.    . senna (SENOKOT) 8.6 MG tablet Take 4 tablets by mouth 2 (two) times daily. Patient takes this regularly    . sildenafil (VIAGRA) 50 MG tablet  Take 50 mg by mouth as needed.     Marland Kitchen tiZANidine (ZANAFLEX) 4 MG capsule Take by mouth.    Cristino Martes CAPS Take 3 capsules by mouth daily. 1605 mg capsules    . Vitamin D, Cholecalciferol, 1000 units CAPS Take 1,000 Units by mouth 5 (five) times daily.     No current facility-administered medications on file prior to visit.      Objective:  Objective  Physical Exam Vitals signs and nursing note reviewed.  Constitutional:      General: He is sleeping.     Appearance: He is well-developed.  HENT:     Head: Normocephalic and atraumatic.  Eyes:     Pupils: Pupils are equal, round, and reactive to light.  Neck:     Musculoskeletal: Normal range of motion and neck supple.     Thyroid: No thyromegaly.  Cardiovascular:     Rate and Rhythm: Normal rate and regular rhythm.     Heart sounds: No murmur.  Pulmonary:     Effort: Pulmonary effort is normal. No respiratory distress.     Breath sounds: Normal breath sounds. No wheezing or rales.  Chest:     Chest wall: No tenderness.  Musculoskeletal:        General: No tenderness.  Skin:    General: Skin is warm and dry.  Neurological:     Mental Status: He is oriented to person, place, and time.  Psychiatric:        Behavior: Behavior normal.        Thought Content: Thought content normal.        Judgment: Judgment normal.    BP (!) 108/57 (BP Location: Left Arm, Patient Position: Sitting, Cuff Size: Normal)   Pulse 61   Temp 98.4 F (36.9 C) (Oral)   Resp 18   Ht 6\' 4"  (1.93 m)   Wt 209 lb 6.4 oz (95 kg)   SpO2 98%   BMI 25.49 kg/m  Wt Readings from Last 3 Encounters:  02/08/19 209 lb (94.8 kg)  02/08/19 209 lb 6.4 oz (95 kg)  11/24/17 211 lb (95.7 kg)     Lab Results  Component Value Date   WBC 7.3 07/22/2012   HGB 11.3 (L) 07/22/2012   HCT 33.6 (L) 07/22/2012   PLT 149 (L) 07/22/2012   GLUCOSE 113 (H) 07/22/2012   NA 138 07/22/2012   K 3.9 07/22/2012   CL 104 07/22/2012   CREATININE 0.90 07/22/2012   BUN 13  07/22/2012   CO2 26 07/22/2012   INR 1.07 07/09/2012    No results found.   Assessment & Plan:  Plan  I have discontinued Shanon Brow L. Keaney's oxyCODONE, baclofen, aspirin, diclofenac sodium, [employer], and pravastatin. I am  also having him maintain his senna, sildenafil, gabapentin, fish oil-omega-3 fatty acids, Lactobacillus, docusate sodium, multivitamin with minerals, OVER THE COUNTER MEDICATION, [place], Garlic Oil, Vitamin D (Cholecalciferol), diphenhydrAMINE, alendronate, calcium carbonate, ketoconazole, B Complex-C (SUPER B COMPLEX PO), clindamycin, ketoconazole, metoprolol tartrate, POLYETHYLENE GLYCOL 3350 PO, aspirin EC, DULoxetine, Oxycodone HCl, tiZANidine, atorvastatin, and Methylsulfonylmethane.  1. Aneurysm, common iliac artery ([place]) [facility]   2. Coronary artery disease involving native coronary artery of native heart without angina pectoris Per cardiology  Labs reviewed and records from [place]  3. Other chronic pain Per pain management   4. Neuromuscular disorder ([place]) [facility]   5. Renal cell carcinoma of left kidney [facility]) Per oncology --- records reviewed   No orders of the defined types were placed in this encounter.   Problem List Items Addressed This Visit      Unprioritized   Aneurysm, common iliac artery ([place])    [facility]      Relevant Medications   aspirin EC 81 MG tablet   atorvastatin (LIPITOR) 40 MG tablet   CAD (coronary artery disease)    [facility]ology at [facility]      Relevant Medications   aspirin EC 81 MG tablet   atorvastatin (LIPITOR) 40 MG tablet   Chronic pain    [facility]       Relevant Medications   aspirin EC 81 MG tablet   DULoxetine (CYMBALTA) 20 MG capsule   Oxycodone HCl 10 MG TABS   tiZANidine (ZANAFLEX) 4 MG capsule   Neuromuscular disorder ([facility])    [facility]      Relevant Medications   DULoxetine (CYMBALTA) 20 MG capsule   tiZANidine (ZANAFLEX) 4 MG capsule   Osteopenia    Renal cell carcinoma ([facility])    [facility]      Relevant Medications   aspirin EC 81 MG tablet    Other Visit Diagnoses    Former smoker    -  Primary      Follow-up: Return in about 1 year (around 02/08/2020) for hypertension, hyperlipidemia.  Ann Held, DO

## 2019-02-08 NOTE — Assessment & Plan Note (Signed)
[facility]

## 2019-02-08 NOTE — Assessment & Plan Note (Signed)
[facility]

## 2019-02-08 NOTE — Assessment & Plan Note (Signed)
[facility]

## 2019-02-08 NOTE — Patient Instructions (Signed)
See you next year!  Continue to eat heart healthy diet (full of fruits, vegetables, whole grains, lean protein, water--limit salt, fat, and sugar intake) and increase physical activity as tolerated.  Continue doing brain stimulating activities (puzzles, reading, adult coloring books, staying active) to keep memory sharp.   Bring a copy of your living will and/or healthcare power of attorney to your next office visit.   Christopher Wheeler , Thank you for taking time to come for your Medicare Wellness Visit. I appreciate your ongoing commitment to your health goals. Please review the following plan we discussed and let me know if I can assist you in the future.   These are the goals we discussed: Goals    . Maintain current health       This is a list of the screening recommended for you and due dates:  Health Maintenance  Topic Date Due  .  Hepatitis C: One time screening is recommended by Center for Disease Control  (CDC) for  adults born from [birth year] through 1964.   07-13-1946  . Colon Cancer Screening  08/12/2017  . Tetanus Vaccine  11/14/2025    Health Maintenance After Age 61 After age 87, you are at a higher risk for certain long-term diseases and infections as well as injuries from falls. Falls are a major cause of broken bones and head injuries in people who are older than age 28. Getting regular preventive care can help to keep you healthy and well. Preventive care includes getting regular testing and making lifestyle changes as recommended by your health care provider. Talk with your health care provider about:  Which screenings and tests you should have. A screening is a test that checks for a disease when you have no symptoms.  A diet and exercise plan that is right for you. What should I know about screenings and tests to prevent falls? Screening and testing are the best ways to find a health problem early. Early diagnosis and treatment give you the best chance of managing medical  conditions that are common after age 10. Certain conditions and lifestyle choices may make you more likely to have a fall. Your health care provider may recommend:  Regular vision checks. Poor vision and conditions such as cataracts can make you more likely to have a fall. If you wear glasses, make sure to get your prescription updated if your vision changes.  Medicine review. Work with your health care provider to regularly review all of the medicines you are taking, including over-the-counter medicines. Ask your health care provider about any side effects that may make you more likely to have a fall. Tell your health care provider if any medicines that you take make you feel dizzy or sleepy.  Osteoporosis screening. Osteoporosis is a condition that causes the bones to get weaker. This can make the bones weak and cause them to break more easily.  Blood pressure screening. Blood pressure changes and medicines to control blood pressure can make you feel dizzy.  Strength and balance checks. Your health care provider may recommend certain tests to check your strength and balance while standing, walking, or changing positions.  Foot health exam. Foot pain and numbness, as well as not wearing proper footwear, can make you more likely to have a fall.  Depression screening. You may be more likely to have a fall if you have a fear of falling, feel emotionally low, or feel unable to do activities that you used to do.  Alcohol use  screening. Using too much alcohol can affect your balance and may make you more likely to have a fall. What actions can I take to lower my risk of falls? General instructions  Talk with your health care provider about your risks for falling. Tell your health care provider if: ? You fall. Be sure to tell your health care provider about all falls, even ones that seem minor. ? You feel dizzy, sleepy, or off-balance.  Take over-the-counter and prescription medicines only as told  by your health care provider. These include any supplements.  Eat a healthy diet and maintain a healthy weight. A healthy diet includes low-fat dairy products, low-fat (lean) meats, and fiber from whole grains, beans, and lots of fruits and vegetables. Home safety  Remove any tripping hazards, such as rugs, cords, and clutter.  Install safety equipment such as grab bars in bathrooms and safety rails on stairs.  Keep rooms and walkways well-lit. Activity   Follow a regular exercise program to stay fit. This will help you maintain your balance. Ask your health care provider what types of exercise are appropriate for you.  If you need a cane or walker, use it as recommended by your health care provider.  Wear supportive shoes that have nonskid soles. Lifestyle  Do not drink alcohol if your health care provider tells you not to drink.  If you drink alcohol, limit how much you have: ? 0-1 drink a day for women. ? 0-2 drinks a day for men.  Be aware of how much alcohol is in your drink. In the [place] one drink equals one typical bottle of beer (12 oz), one-half glass of wine (5 oz), or one shot of hard liquor (1 oz).  Do not use any products that contain nicotine or tobacco, such as cigarettes and e-cigarettes. If you need help quitting, ask your health care provider. Summary  Having a healthy lifestyle and getting preventive care can help to protect your health and wellness after age 96.  Screening and testing are the best way to find a health problem early and help you avoid having a fall. Early diagnosis and treatment give you the best chance for managing medical conditions that are more common for people who are older than age 63.  Falls are a major cause of broken bones and head injuries in people who are older than age 82. Take precautions to prevent a fall at home.  Work with your health care provider to learn what changes you can make to improve your health and wellness and to  prevent falls. This information is not intended to replace advice given to you by your health care provider. Make sure you discuss any questions you have with your health care provider. Document Released: 05/28/2017 Document Revised: 11/05/2018 Document Reviewed: 05/28/2017 Elsevier Patient Education  2020 [employer].

## 2019-02-08 NOTE — Assessment & Plan Note (Signed)
Per cardiology at [facility]

## 2019-02-08 NOTE — Assessment & Plan Note (Signed)
[facility] 

## 2019-03-08 DIAGNOSIS — H25813 Combined forms of age-related cataract, bilateral: Secondary | ICD-10-CM | POA: Diagnosis not present

## 2019-03-08 DIAGNOSIS — H524 Presbyopia: Secondary | ICD-10-CM | POA: Diagnosis not present

## 2019-03-08 DIAGNOSIS — H40013 Open angle with borderline findings, low risk, bilateral: Secondary | ICD-10-CM | POA: Diagnosis not present

## 2019-03-08 DIAGNOSIS — H5213 Myopia, bilateral: Secondary | ICD-10-CM | POA: Diagnosis not present

## 2019-03-08 DIAGNOSIS — H52223 Regular astigmatism, bilateral: Secondary | ICD-10-CM | POA: Diagnosis not present

## 2019-09-13 DIAGNOSIS — Z889 Allergy status to unspecified drugs, medicaments and biological substances status: Secondary | ICD-10-CM | POA: Diagnosis not present

## 2019-10-07 DIAGNOSIS — T782XXD Anaphylactic shock, unspecified, subsequent encounter: Secondary | ICD-10-CM | POA: Diagnosis not present

## 2019-10-07 DIAGNOSIS — Z880 Allergy status to penicillin: Secondary | ICD-10-CM | POA: Diagnosis not present

## 2019-10-07 DIAGNOSIS — Z884 Allergy status to anesthetic agent status: Secondary | ICD-10-CM | POA: Diagnosis not present

## 2019-11-04 DIAGNOSIS — M5441 Lumbago with sciatica, right side: Secondary | ICD-10-CM | POA: Diagnosis not present

## 2019-11-04 DIAGNOSIS — M5442 Lumbago with sciatica, left side: Secondary | ICD-10-CM | POA: Diagnosis not present

## 2019-11-04 DIAGNOSIS — M79641 Pain in right hand: Secondary | ICD-10-CM | POA: Diagnosis not present

## 2019-11-04 DIAGNOSIS — G8929 Other chronic pain: Secondary | ICD-10-CM | POA: Diagnosis not present

## 2020-02-11 NOTE — Progress Notes (Addendum)
Subjective:   Christopher Wheeler is a 74 y.o. male who presents for Medicare Annual/Subsequent preventive examination.  Review of Systems    Cardiac Risk Factors include: advanced age (>80men, >68 women)     Objective:    Today's Vitals   02/14/20 1332 02/14/20 1358  BP: 122/80   Pulse: 64   Temp: 98.7 F (37.1 C)   TempSrc: Temporal   SpO2: 98%   Weight: 208 lb (94.3 kg)   Height: 6\' 4"  (1.93 m)   PainSc:  7    Body mass index is 25.32 kg/m.  Advanced Directives 02/14/2020 02/08/2019 02/08/2019 11/24/2017 07/21/2012 07/09/2012  Does Patient Have a Medical Advance Directive? Yes Yes No No Patient does not have advance directive Patient does not have advance directive  Type of Advance Directive [place];Living will [place];Living will - - - -  Does patient want to make changes to medical advance directive? No - Patient declined No - Patient declined - - - -  Copy of [place] in Chart? No - copy requested No - copy requested - - - -  Would patient like information on creating a medical advance directive? - - No - Patient declined No - Patient declined - -  Pre-existing out of facility DNR order (yellow form or pink MOST form) - - - - No No    Current Medications (verified) Outpatient Encounter Medications as of 02/14/2020  Medication Sig   alendronate (FOSAMAX) 70 MG tablet Take 70 mg by mouth once a week. Take with a full glass of water on an empty stomach.   aspirin EC 81 MG tablet Take 81 mg by mouth daily.   atorvastatin (LIPITOR) 40 MG tablet Take 40 mg by mouth daily.   B Complex-C (SUPER B COMPLEX PO) Take 1 tablet by mouth daily.   calcium carbonate (TUMS - DOSED IN MG ELEMENTAL CALCIUM) 500 MG chewable tablet Chew 1 tablet by mouth 2 (two) times daily.   diphenhydrAMINE (BENADRYL) 25 mg capsule Take 50 mg by mouth at bedtime as needed for sleep.   docusate sodium (COLACE) 100 MG capsule Take 100 mg by mouth 3  (three) times daily. Patient takes this reguarly   DULoxetine (CYMBALTA) 30 MG capsule Take 30 mg by mouth daily.   fish oil-omega-3 fatty acids 1000 MG capsule Take 2 g by mouth 3 (three) times daily.   gabapentin (NEURONTIN) 300 MG capsule Take 600 mg by mouth 3 (three) times daily.   Garlic Oil 0865 MG CAPS Take 1,000 mg by mouth daily.    ketoconazole (NIZORAL) 2 % cream Apply 1 application topically as needed for irritation. Apply to affected area.   ketoconazole (NIZORAL) 2 % shampoo Apply 1 application topically 3 (three) times a week.   Lactobacillus CHEW Chew 2 tablets by mouth 2 (two) times daily.   Methylsulfonylmethane 1000 MG CAPS Take 1,000 mg by mouth. Take 3 caps daily   metoprolol tartrate (LOPRESSOR) 25 MG tablet Take 12.5 mg by mouth 2 (two) times daily.    Multiple Vitamin (MULTIVITAMIN WITH MINERALS) TABS Take 1 tablet by mouth daily.   OVER THE COUNTER MEDICATION Take 1,000 capsules by mouth 3 (three) times daily. Methyl-sulfonyl-methane   oxyCODONE (OXY IR/ROXICODONE) 5 MG immediate release tablet Take 5 mg by mouth every 4 (four) hours as needed for severe pain.   Oxycodone HCl 10 MG TABS Take 10 mg by mouth. Per pt taking 3 times a day  POLYETHYLENE GLYCOL 3350 PO Take 25 mg by mouth at bedtime.   senna (SENOKOT) 8.6 MG tablet Take 4 tablets by mouth 2 (two) times daily. Patient takes this regularly   tiZANidine (ZANAFLEX) 4 MG capsule Take by mouth.   Valerian CAPS Take 3 capsules by mouth daily. 1605 mg capsules   Vitamin D, Cholecalciferol, 1000 units CAPS Take 1,000 Units by mouth 5 (five) times daily.   clindamycin (CLEOCIN) 300 MG capsule Take 600 mg by mouth as needed (Prior to Dental Work). (Patient not taking: Reported on 02/14/2020)   sildenafil (VIAGRA) 50 MG tablet Take 50 mg by mouth as needed.  (Patient not taking: Reported on 02/14/2020)   [DISCONTINUED] DULoxetine (CYMBALTA) 20 MG capsule Take 20 mg by mouth daily. (Patient not taking: Reported on  02/14/2020)   No facility-administered encounter medications on file as of 02/14/2020.    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, C4 lesion)-right side muscle wasting   Past Surgical History:  Procedure Laterality Date   COLONOSCOPY W/ POLYPECTOMY  2016   [place]  07/21/2012   Procedure: TOTAL HIP ARTHROPLASTY ANTERIOR APPROACH;  Surgeon: Mauri Pole, MD;  Location: [facility];  Service: Orthopedics;  Laterality: Left;   Family History  Problem Relation Age of Onset   Diabetes Sister    Diabetes Mellitus II Paternal Grandmother    Social History   Socioeconomic History   Marital status: Married    Spouse name: Not on file   Number of children: Not on file   Years of education: Not on file   Highest education level: Not on file  Occupational History   Not on file  Tobacco Use   Smoking status: Former Smoker    Types: Cigarettes, Pipe    Quit date: 07/09/2000    Years since quitting: 19.6   Smokeless tobacco: Never Used  Substance and Sexual Activity   Alcohol use: No    Comment: 1'2007-none since pain management tx.   Drug use: No   Sexual activity: Yes  Other Topics Concern   Not on file  Social History Narrative   Not on file   Social Determinants of Health   Financial Resource Strain: Low Risk    Difficulty of Paying Living Expenses: Not hard at all  Food Insecurity: No Food Insecurity   Worried About Charity fundraiser in the Last Year: Never true   [place] in the Last Year: Never true  Transportation  Needs: No Transportation Needs   Lack of Transportation (Medical): No   Lack of Transportation (Non-Medical): No  Physical Activity:    Days of Exercise per Week:    Minutes of Exercise per Session:   Stress:    Feeling of Stress :   Social Connections:    Frequency of Communication with Friends and Family:    Frequency of Social Gatherings with Friends and Family:    Attends Religious Services:    Active Member of Clubs or Organizations:    Attends Archivist Meetings:    Marital Status:     Tobacco Counseling Counseling given: Not Answered  Clinical Intake:     Pain : 0-10 Pain Score: 7  Pain Type: Chronic pain Pain Location: Leg Pain Orientation: Right, Left Pain Descriptors / Indicators: Radiating Pain Onset: More than a month ago Pain Frequency: Constant Pain Relieving Factors: Pain meds  Pain Relieving Factors: Pain meds               Activities of Daily Living In your present state of health, do you have any difficulty performing the following activities: 02/14/2020 02/14/2020  Hearing? Y Y  Comment hearing aids -  Vision? N N  Difficulty concentrating or making decisions? N N  Walking or climbing stairs? Y Y  Dressing or bathing? N N  Doing errands, shopping? N N  Preparing Food and eating ? N -  Using the Toilet? N -  In the past six months, have you accidently leaked urine? N -  Do you have problems with loss of bowel control? N -  Managing your Medications? N -  Managing your Finances? N -  Housekeeping or managing your Housekeeping? N -  Some recent data might be hidden    Patient Care Team: Carollee Herter, Alferd Apa, DO as PCP - General (Family Medicine) Marica Otter, [facility]) [place] as Referring Physician (General Practice)  Indicate any recent Medical Services you may have received from other than [facility] providers in the past year (date may be approximate).     Assessment:   This is a routine wellness  examination for Christopher Wheeler.  Hearing/Vision screen No exam data present  Dietary issues and exercise activities discussed: Current Exercise Habits: The patient does not participate in regular exercise at present, Exercise limited by: None identified Diet (meal preparation, eat out, water intake, caffeinated beverages, dairy products, fruits and vegetables): well balanced   Goals      Maintain current health       Depression Screen PHQ 2/9 Scores 02/14/2020 02/14/2020 02/08/2019 11/21/2016  PHQ - 2 Score 0 0 0 0    Fall Risk Fall Risk  02/14/2020 02/14/2020 02/08/2019 11/24/2017 11/21/2016  Falls in the past year? 1 0 0 No No  Number falls in past yr: 0 0 - - -  Injury with Fall? 0 0 - - -  Follow up Education provided;Falls prevention discussed Falls evaluation completed - - -   Lives alone in a 1 story home. Any stairs in or around the home? No  If so, are there any without handrails? No  Home free of loose throw rugs in walkways, pet beds, electrical cords, etc? Yes  Adequate lighting in your home to reduce risk of falls? Yes   ASSISTIVE DEVICES UTILIZED TO PREVENT FALLS:  Life alert? No  Use of a cane, walker or w/c? Yes  Grab bars in the bathroom? Yes  Shower chair or bench in shower? Yes  Elevated toilet seat or a handicapped toilet? Yes   TIMED UP AND GO:  Was the test performed? No .    Gait steady and fast with assistive device  Cognitive Function: Ad8 score reviewed for issues: Issues making decisions: Less interest in hobbies / activities: Repeats questions, stories (family complaining): Trouble using ordinary gadgets (microwave, computer, phone): Forgets the month or year:  Mismanaging finances:  Remembering appts: Daily problems with thinking and/or memory: Ad8 score is=            Immunizations  There is no immunization history on file for this patient.   "Immunizations done through [place] if done  at all." Covid-19 vaccine status: Declined, Education  has been provided regarding the importance of this vaccine but patient still declined. Advised may receive this vaccine at local pharmacy or Health Dept.or vaccine clinic. Aware to provide a copy of the vaccination record if obtained from local pharmacy or Health Dept. Verbalized acceptance and understanding.    Screening Tests Health Maintenance  Topic Date Due   Hepatitis C Screening  Never done   COVID-19 Vaccine (1) Never done   COLONOSCOPY  08/12/2017   TETANUS/TDAP  11/14/2025    Health Maintenance  Health Maintenance Due  Topic Date Due   Hepatitis C Screening  Never done   COVID-19 Vaccine (1) Never done   COLONOSCOPY  08/12/2017    Colon cancer screening: next due 2022 per pt at [place] Screening: (Low Dose CT Chest recommended if Age 19-80 years, 30 pack-year currently smoking OR have quit w/in 15years.) does not qualify.   Additional Screening:   Vision Screening: Recommended annual ophthalmology exams for early detection of glaucoma and other disorders of the eye.   Dental Screening: Recommended annual dental exams for proper oral hygiene  Community Resource Referral / Chronic Care Management: CRR required this visit?  No   CCM required this visit?  No      Plan:    Please schedule your next medicare wellness visit with me in 1 yr.  Continue to eat heart healthy diet (full of fruits, vegetables, whole grains, lean protein, water--limit salt, fat, and sugar intake) and increase physical activity as tolerated.  Continue doing brain stimulating activities (puzzles, reading, adult coloring books, staying active) to keep memory sharp.    I have personally reviewed and noted the following in the patients chart:   Medical and social history Use of alcohol, tobacco or illicit drugs  Current medications and supplements Functional ability and status Nutritional status Physical activity Advanced directives List of other  physicians Hospitalizations, surgeries, and ER visits in previous 12 months Vitals Screenings to include cognitive, depression, and falls Referrals and appointments  In addition, I have reviewed and discussed with patient certain preventive protocols, quality metrics, and best practice recommendations. A written personalized care plan for preventive services as well as general preventive health recommendations were provided to patient.     Shela Nevin, RN   02/14/2020    Reviewed Ann Held, DO

## 2020-02-14 ENCOUNTER — Ambulatory Visit (INDEPENDENT_AMBULATORY_CARE_PROVIDER_SITE_OTHER): Payer: Medicare Other | Admitting: Family Medicine

## 2020-02-14 ENCOUNTER — Ambulatory Visit (INDEPENDENT_AMBULATORY_CARE_PROVIDER_SITE_OTHER): Payer: Medicare Other | Admitting: *Deleted

## 2020-02-14 ENCOUNTER — Other Ambulatory Visit: Payer: Self-pay

## 2020-02-14 ENCOUNTER — Encounter: Payer: Self-pay | Admitting: Family Medicine

## 2020-02-14 ENCOUNTER — Encounter: Payer: Self-pay | Admitting: *Deleted

## 2020-02-14 VITALS — BP 122/80 | HR 64 | Temp 98.7°F | Ht 76.0 in | Wt 208.0 lb

## 2020-02-14 VITALS — BP 122/80 | HR 64 | Temp 98.7°F | Resp 18 | Ht 76.0 in | Wt 208.8 lb

## 2020-02-14 DIAGNOSIS — Z0000 Encounter for general adult medical examination without abnormal findings: Secondary | ICD-10-CM

## 2020-02-14 DIAGNOSIS — M810 Age-related osteoporosis without current pathological fracture: Secondary | ICD-10-CM

## 2020-02-14 DIAGNOSIS — I723 Aneurysm of iliac artery: Secondary | ICD-10-CM | POA: Diagnosis not present

## 2020-02-14 DIAGNOSIS — M8589 Other specified disorders of bone density and structure, multiple sites: Secondary | ICD-10-CM | POA: Diagnosis not present

## 2020-02-14 DIAGNOSIS — G8929 Other chronic pain: Secondary | ICD-10-CM | POA: Diagnosis not present

## 2020-02-14 DIAGNOSIS — E785 Hyperlipidemia, unspecified: Secondary | ICD-10-CM

## 2020-02-14 DIAGNOSIS — M48061 Spinal stenosis, lumbar region without neurogenic claudication: Secondary | ICD-10-CM

## 2020-02-14 DIAGNOSIS — R911 Solitary pulmonary nodule: Secondary | ICD-10-CM | POA: Diagnosis not present

## 2020-02-14 DIAGNOSIS — I10 Essential (primary) hypertension: Secondary | ICD-10-CM | POA: Diagnosis not present

## 2020-02-14 DIAGNOSIS — I700 Atherosclerosis of aorta: Secondary | ICD-10-CM

## 2020-02-14 DIAGNOSIS — G709 Myoneural disorder, unspecified: Secondary | ICD-10-CM

## 2020-02-14 NOTE — Patient Instructions (Signed)
Please schedule your next medicare wellness visit with me in 1 yr.  Continue to eat heart healthy diet (full of fruits, vegetables, whole grains, lean protein, water--limit salt, fat, and sugar intake) and increase physical activity as tolerated.  Continue doing brain stimulating activities (puzzles, reading, adult coloring books, staying active) to keep memory sharp.    [name] , Thank you for taking time to come for your Medicare Wellness Visit. I appreciate your ongoing commitment to your health goals. Please review the following plan we discussed and let me know if I can assist you in the future.   These are the goals we discussed: Goals    . Maintain current health       This is a list of the screening recommended for you and due dates:  Health Maintenance  Topic Date Due  .  Hepatitis C: One time screening is recommended by Center for Disease Control  (CDC) for  adults born from [birth year] through 1964.   Never done  . COVID-19 Vaccine (1) Never done  . Colon Cancer Screening  08/12/2017  . Tetanus Vaccine  11/14/2025    Preventive Care 65 Years and Older, Male Preventive care refers to lifestyle choices and visits with your health care provider that can promote health and wellness. This includes:  A yearly physical exam. This is also called an annual well check.  Regular dental and eye exams.  Immunizations.  Screening for certain conditions.  Healthy lifestyle choices, such as diet and exercise. What can I expect for my preventive care visit? Physical exam Your health care provider will check:  Height and weight. These may be used to calculate body mass index (BMI), which is a measurement that tells if you are at a healthy weight.  Heart rate and blood pressure.  Your skin for abnormal spots. Counseling Your health care provider may ask you questions about:  Alcohol, tobacco, and drug use.  Emotional well-being.  Home and relationship well-being.  Sexual  activity.  Eating habits.  History of falls.  Memory and ability to understand (cognition).  Work and work Astronomer. What immunizations do I need?  Influenza (flu) vaccine  This is recommended every year. Tetanus, diphtheria, and pertussis (Tdap) vaccine  You may need a Td booster every 10 years. Varicella (chickenpox) vaccine  You may need this vaccine if you have not already been vaccinated. Zoster (shingles) vaccine  You may need this after age 45. Pneumococcal conjugate (PCV13) vaccine  One dose is recommended after age 93. Pneumococcal polysaccharide (PPSV23) vaccine  One dose is recommended after age 43. Measles, mumps, and rubella (MMR) vaccine  You may need at least one dose of MMR if you were born in 1957 or later. You may also need a second dose. Meningococcal conjugate (MenACWY) vaccine  You may need this if you have certain conditions. Hepatitis A vaccine  You may need this if you have certain conditions or if you travel or work in places where you may be exposed to hepatitis A. Hepatitis B vaccine  You may need this if you have certain conditions or if you travel or work in places where you may be exposed to hepatitis B. Haemophilus influenzae type b (Hib) vaccine  You may need this if you have certain conditions. You may receive vaccines as individual doses or as more than one vaccine together in one shot (combination vaccines). Talk with your health care provider about the risks and benefits of combination vaccines. What tests do I  need? Blood tests  Lipid and cholesterol levels. These may be checked every 5 years, or more frequently depending on your overall health.  Hepatitis C test.  Hepatitis B test. Screening  Lung cancer screening. You may have this screening every year starting at age 18 if you have a 30-pack-year history of smoking and currently smoke or have quit within the past 15 years.  Colorectal cancer screening. All adults  should have this screening starting at age 70 and continuing until age 78. Your health care provider may recommend screening at age 23 if you are at increased risk. You will have tests every 1-10 years, depending on your results and the type of screening test.  Prostate cancer screening. Recommendations will vary depending on your family history and other risks.  Diabetes screening. This is done by checking your blood sugar (glucose) after you have not eaten for a while (fasting). You may have this done every 1-3 years.  Abdominal aortic aneurysm (AAA) screening. You may need this if you are a current or former smoker.  Sexually transmitted disease (STD) testing. Follow these instructions at home: Eating and drinking  Eat a diet that includes fresh fruits and vegetables, whole grains, lean protein, and low-fat dairy products. Limit your intake of foods with high amounts of sugar, saturated fats, and salt.  Take vitamin and mineral supplements as recommended by your health care provider.  Do not drink alcohol if your health care provider tells you not to drink.  If you drink alcohol: ? Limit how much you have to 0-2 drinks a day. ? Be aware of how much alcohol is in your drink. In the [place] one drink equals one 12 oz bottle of beer (355 mL), one 5 oz glass of wine (148 mL), or one 1 oz glass of hard liquor (44 mL). Lifestyle  Take daily care of your teeth and gums.  Stay active. Exercise for at least 30 minutes on 5 or more days each week.  Do not use any products that contain nicotine or tobacco, such as cigarettes, e-cigarettes, and chewing tobacco. If you need help quitting, ask your health care provider.  If you are sexually active, practice safe sex. Use a condom or other form of protection to prevent STIs (sexually transmitted infections).  Talk with your health care provider about taking a low-dose aspirin or statin. What's next?  Visit your health care provider once a year  for a well check visit.  Ask your health care provider how often you should have your eyes and teeth checked.  Stay up to date on all vaccines. This information is not intended to replace advice given to you by your health care provider. Make sure you discuss any questions you have with your health care provider. Document Revised: 07/09/2018 Document Reviewed: 07/09/2018 Elsevier Patient Education  2020 [employer].

## 2020-02-14 NOTE — Progress Notes (Signed)
Patient ID: Christopher Wheeler, male    DOB: [birth year]  Age: 74 y.o. MRN: [id number]   Subjective:  Subjective  HPI Christopher Wheeler presents for f/u.  He sees [place] [facility] for everything.    He has vascular surgery in dec and has done well since.  He had an anaphylactic reaction and was in [facility]---  He had a reaction to ancef.   He is having mri back , l kidney and has seen cardiolgoy at [facility] and pcp at [place]. And he will see [facility] for his back   He brings with him his medical records from this past year at the [place] and [facility].  His records from [place] can also be seen in Care everywhere.  Pt has no new complaints today.  He is walking with his walker  Pt currently has no new complaints  Review of Systems  Constitutional: Negative for appetite change, diaphoresis, fatigue and unexpected weight change.  Eyes: Negative for pain, redness and visual disturbance.  Respiratory: Negative for cough, chest tightness, shortness of breath and wheezing.   Cardiovascular: Negative for chest pain, palpitations and leg swelling.  Endocrine: Negative for cold intolerance, heat intolerance, polydipsia, polyphagia and polyuria.  Genitourinary: Negative for difficulty urinating, dysuria and frequency.  Musculoskeletal: Positive for arthralgias and gait problem.  Neurological: Positive for weakness. Negative for dizziness, light-headedness, numbness and headaches.    History Past Medical History:  Diagnosis Date  . Arthritis    osteoarthritis-hips  . Chronic pain disorder 07-09-12   under chronic pain management through [place]   . 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   His family history includes Diabetes in his sister; Diabetes Mellitus II in his paternal  grandmother.He reports that he quit smoking about 19 years ago. His smoking use included cigarettes and pipe. He has never used smokeless tobacco. He reports that he does not drink alcohol and does not use drugs.  Current Outpatient Medications on File Prior to Visit  Medication Sig Dispense Refill  . alendronate (FOSAMAX) 70 MG tablet Take 70 mg by mouth once a week. Take with a full glass of water on an empty stomach.    Marland Kitchen aspirin EC 81 MG tablet Take 81 mg by mouth daily.    Marland Kitchen atorvastatin (LIPITOR) 40 MG tablet Take 40 mg by mouth daily.    . B Complex-C (SUPER B COMPLEX PO) Take 1 tablet by mouth daily.    . calcium carbonate (TUMS - DOSED IN MG ELEMENTAL CALCIUM) 500 MG chewable tablet Chew 1 tablet by mouth 2 (two) times daily.    . diphenhydrAMINE (BENADRYL) 25 mg capsule Take 50 mg by mouth at bedtime as needed for sleep.    Marland Kitchen docusate sodium (COLACE) 100 MG capsule Take 100 mg by mouth 3 (three) times daily. Patient takes this reguarly    . DULoxetine (CYMBALTA) 30 MG capsule Take 30 mg by mouth daily.    . fish oil-omega-3 fatty acids 1000 MG capsule Take 2 g by mouth 3 (three) times daily.    Marland Kitchen gabapentin (NEURONTIN) 300 MG capsule Take 600 mg by mouth 3 (three) times daily.    . Garlic Oil 3532 MG CAPS Take 1,000 mg by mouth daily.     Marland Kitchen ketoconazole (NIZORAL) 2 % cream  Apply 1 application topically as needed for irritation. Apply to affected area.    Marland Kitchen ketoconazole (NIZORAL) 2 % shampoo Apply 1 application topically 3 (three) times a week.    . Lactobacillus CHEW Chew 2 tablets by mouth 2 (two) times daily.    . Methylsulfonylmethane 1000 MG CAPS Take 1,000 mg by mouth. Take 3 caps daily    . metoprolol tartrate (LOPRESSOR) 25 MG tablet Take 12.5 mg by mouth 2 (two) times daily.     . Multiple Vitamin (MULTIVITAMIN WITH MINERALS) TABS Take 1 tablet by mouth daily.    Marland Kitchen OVER THE COUNTER MEDICATION Take 1,000 capsules by mouth 3 (three) times daily. Methyl-sulfonyl-methane    .  oxyCODONE (OXY IR/ROXICODONE) 5 MG immediate release tablet Take 5 mg by mouth every 4 (four) hours as needed for severe pain.    . Oxycodone HCl 10 MG TABS Take 10 mg by mouth. Per pt taking 3 times a day    . POLYETHYLENE GLYCOL 3350 PO Take 25 mg by mouth at bedtime.    . senna (SENOKOT) 8.6 MG tablet Take 4 tablets by mouth 2 (two) times daily. Patient takes this regularly    . tiZANidine (ZANAFLEX) 4 MG capsule Take by mouth.    Cristino Martes CAPS Take 3 capsules by mouth daily. 1605 mg capsules    . Vitamin D, Cholecalciferol, 1000 units CAPS Take 1,000 Units by mouth 5 (five) times daily.     No current facility-administered medications on file prior to visit.     Objective:  Objective  Physical Exam Vitals and nursing note reviewed.  Constitutional:      General: He is sleeping. He is not in acute distress.    Appearance: He is well-developed. He is not diaphoretic.  HENT:     Head: Normocephalic and atraumatic.     Right Ear: External ear normal.     Left Ear: External ear normal.     Nose: Nose normal.     Mouth/Throat:     Pharynx: No oropharyngeal exudate.  Eyes:     General:        Right eye: No discharge.        Left eye: No discharge.     Conjunctiva/sclera: Conjunctivae normal.     Pupils: Pupils are equal, round, and reactive to light.  Neck:     Thyroid: No thyromegaly.     Vascular: No JVD.  Cardiovascular:     Rate and Rhythm: Normal rate and regular rhythm.     Heart sounds: No murmur heard.   Pulmonary:     Effort: Pulmonary effort is normal. No respiratory distress.     Breath sounds: Normal breath sounds. No wheezing or rales.  Chest:     Chest wall: No tenderness.  Abdominal:     General: Bowel sounds are normal. There is no distension.     Palpations: Abdomen is soft. There is no mass.     Tenderness: There is no abdominal tenderness. There is no guarding or rebound.  Genitourinary:    Penis: Normal.      Prostate: Normal.  Musculoskeletal:          General: Tenderness present. Normal range of motion.     Cervical back: Normal range of motion and neck supple.  Lymphadenopathy:     Cervical: No cervical adenopathy.  Skin:    General: Skin is warm and dry.     Findings: No erythema or rash.  Neurological:  Mental Status: He is oriented to person, place, and time.     Cranial Nerves: No cranial nerve deficit.     Motor: No abnormal muscle tone.     Deep Tendon Reflexes: Reflexes are normal and symmetric. Reflexes normal.  Psychiatric:        Behavior: Behavior normal.        Thought Content: Thought content normal.        Judgment: Judgment normal.    BP 122/80 (BP Location: Right Arm, Patient Position: Sitting, Cuff Size: Large)   Pulse 64   Temp 98.7 F (37.1 C) (Oral)   Resp 18   Ht 6\' 4"  (1.93 m)   Wt 208 lb 12.8 oz (94.7 kg)   SpO2 98%   BMI 25.42 kg/m  Wt Readings from Last 3 Encounters:  02/14/20 208 lb (94.3 kg)  02/14/20 208 lb 12.8 oz (94.7 kg)  02/08/19 209 lb (94.8 kg)     Lab Results  Component Value Date   WBC 7.3 07/22/2012   HGB 11.3 (L) 07/22/2012   HCT 33.6 (L) 07/22/2012   PLT 149 (L) 07/22/2012   GLUCOSE 113 (H) 07/22/2012   NA 138 07/22/2012   K 3.9 07/22/2012   CL 104 07/22/2012   CREATININE 0.90 07/22/2012   BUN 13 07/22/2012   CO2 26 07/22/2012   INR 1.07 07/09/2012    No results found.   Assessment & Plan:  Plan  I have discontinued Shanon Brow L. Donaghy's sildenafil and clindamycin. I am also having him maintain his senna, gabapentin, fish oil-omega-3 fatty acids, Lactobacillus, docusate sodium, multivitamin with minerals, OVER THE COUNTER MEDICATION, [place], Garlic Oil, Vitamin D (Cholecalciferol), diphenhydrAMINE, alendronate, calcium carbonate, ketoconazole, B Complex-C (SUPER B COMPLEX PO), ketoconazole, metoprolol tartrate, POLYETHYLENE GLYCOL 3350 PO, aspirin EC, Oxycodone HCl, tiZANidine, atorvastatin, Methylsulfonylmethane, DULoxetine, and oxyCODONE.  No orders of the  defined types were placed in this encounter.   Problem List Items Addressed This Visit      Unprioritized   Age-related osteoporosis without current pathological fracture    [facility]      Atherosclerosis of aorta ([facility])    con't meds stable      Chronic pain    [facility]      Relevant Medications   DULoxetine (CYMBALTA) 30 MG capsule   oxyCODONE (OXY IR/ROXICODONE) 5 MG immediate release tablet   Dyslipidemia - Primary    Labs from [place] reviewed--- to be abstracted Tolerating statin, encouraged heart healthy diet, avoid trans fats, minimize simple carbs and saturated fats. Increase exercise as tolerated      Essential hypertension    Well controlled, no changes to meds. Encouraged heart healthy diet such as the DASH diet and exercise as tolerated.       Neuromuscular disorder ([place])    [facility]      Relevant Medications   DULoxetine (CYMBALTA) 30 MG capsule      Follow-up: Return in about 1 year (around 02/13/2021).  Ann Held, DO

## 2020-02-14 NOTE — Patient Instructions (Signed)

## 2020-02-15 DIAGNOSIS — E785 Hyperlipidemia, unspecified: Secondary | ICD-10-CM | POA: Insufficient documentation

## 2020-02-15 DIAGNOSIS — I10 Essential (primary) hypertension: Secondary | ICD-10-CM | POA: Insufficient documentation

## 2020-02-15 DIAGNOSIS — M810 Age-related osteoporosis without current pathological fracture: Secondary | ICD-10-CM | POA: Insufficient documentation

## 2020-02-15 NOTE — Assessment & Plan Note (Signed)
con't meds stable 

## 2020-02-15 NOTE — Assessment & Plan Note (Signed)
Well controlled, no changes to meds. Encouraged heart healthy diet such as the DASH diet and exercise as tolerated.  °

## 2020-02-15 NOTE — Assessment & Plan Note (Signed)
[facility] On fosamax

## 2020-02-15 NOTE — Assessment & Plan Note (Signed)
[facility] 

## 2020-02-15 NOTE — Assessment & Plan Note (Signed)
Labs from [place] reviewed--- to be abstracted Tolerating statin, encouraged heart healthy diet, avoid trans fats, minimize simple carbs and saturated fats. Increase exercise as tolerated

## 2020-02-16 ENCOUNTER — Other Ambulatory Visit: Payer: Self-pay | Admitting: Family Medicine

## 2020-02-16 ENCOUNTER — Encounter: Payer: Self-pay | Admitting: Family Medicine

## 2020-02-16 NOTE — Progress Notes (Signed)
Already done

## 2020-03-14 DIAGNOSIS — H524 Presbyopia: Secondary | ICD-10-CM | POA: Diagnosis not present

## 2020-03-14 DIAGNOSIS — H52223 Regular astigmatism, bilateral: Secondary | ICD-10-CM | POA: Diagnosis not present

## 2020-03-14 DIAGNOSIS — H25811 Combined forms of age-related cataract, right eye: Secondary | ICD-10-CM | POA: Diagnosis not present

## 2020-03-14 DIAGNOSIS — H5213 Myopia, bilateral: Secondary | ICD-10-CM | POA: Diagnosis not present

## 2020-03-14 DIAGNOSIS — H25812 Combined forms of age-related cataract, left eye: Secondary | ICD-10-CM | POA: Diagnosis not present

## 2020-03-16 ENCOUNTER — Encounter: Payer: Medicare Other | Admitting: Family Medicine

## 2020-06-24 ENCOUNTER — Encounter (HOSPITAL_COMMUNITY): Payer: Self-pay

## 2020-06-24 ENCOUNTER — Emergency Department (HOSPITAL_COMMUNITY): Payer: Medicare Other

## 2020-06-24 ENCOUNTER — Inpatient Hospital Stay (HOSPITAL_COMMUNITY)
Admission: EM | Admit: 2020-06-24 | Discharge: 2020-06-29 | DRG: 536 | Disposition: A | Discharge status: Skilled Nursing Facility | Payer: Medicare Other | Attending: Internal Medicine | Admitting: Internal Medicine

## 2020-06-24 ENCOUNTER — Other Ambulatory Visit: Payer: Self-pay

## 2020-06-24 DIAGNOSIS — Z905 Acquired absence of kidney: Secondary | ICD-10-CM

## 2020-06-24 DIAGNOSIS — S32591A Other specified fracture of right pubis, initial encounter for closed fracture: Secondary | ICD-10-CM | POA: Diagnosis not present

## 2020-06-24 DIAGNOSIS — Z833 Family history of diabetes mellitus: Secondary | ICD-10-CM

## 2020-06-24 DIAGNOSIS — I2510 Atherosclerotic heart disease of native coronary artery without angina pectoris: Secondary | ICD-10-CM

## 2020-06-24 DIAGNOSIS — Z79891 Long term (current) use of opiate analgesic: Secondary | ICD-10-CM

## 2020-06-24 DIAGNOSIS — M5416 Radiculopathy, lumbar region: Secondary | ICD-10-CM | POA: Diagnosis present

## 2020-06-24 DIAGNOSIS — R531 Weakness: Secondary | ICD-10-CM | POA: Diagnosis present

## 2020-06-24 DIAGNOSIS — Y92009 Unspecified place in unspecified non-institutional (private) residence as the place of occurrence of the external cause: Secondary | ICD-10-CM

## 2020-06-24 DIAGNOSIS — Z043 Encounter for examination and observation following other accident: Secondary | ICD-10-CM | POA: Diagnosis not present

## 2020-06-24 DIAGNOSIS — S32810A Multiple fractures of pelvis with stable disruption of pelvic ring, initial encounter for closed fracture: Secondary | ICD-10-CM

## 2020-06-24 DIAGNOSIS — M160 Bilateral primary osteoarthritis of hip: Secondary | ICD-10-CM

## 2020-06-24 DIAGNOSIS — E785 Hyperlipidemia, unspecified: Secondary | ICD-10-CM

## 2020-06-24 DIAGNOSIS — D62 Acute posthemorrhagic anemia: Secondary | ICD-10-CM | POA: Diagnosis not present

## 2020-06-24 DIAGNOSIS — M810 Age-related osteoporosis without current pathological fracture: Secondary | ICD-10-CM | POA: Diagnosis present

## 2020-06-24 DIAGNOSIS — S329XXA Fracture of unspecified parts of lumbosacral spine and pelvis, initial encounter for closed fracture: Secondary | ICD-10-CM | POA: Diagnosis present

## 2020-06-24 DIAGNOSIS — Z20822 Contact with and (suspected) exposure to covid-19: Secondary | ICD-10-CM | POA: Diagnosis present

## 2020-06-24 DIAGNOSIS — I10 Essential (primary) hypertension: Secondary | ICD-10-CM

## 2020-06-24 DIAGNOSIS — Z87891 Personal history of nicotine dependence: Secondary | ICD-10-CM

## 2020-06-24 DIAGNOSIS — Z96642 Presence of left artificial hip joint: Secondary | ICD-10-CM | POA: Diagnosis present

## 2020-06-24 DIAGNOSIS — Z888 Allergy status to other drugs, medicaments and biological substances status: Secondary | ICD-10-CM

## 2020-06-24 DIAGNOSIS — S32501A Unspecified fracture of right pubis, initial encounter for closed fracture: Secondary | ICD-10-CM

## 2020-06-24 DIAGNOSIS — M21371 Foot drop, right foot: Secondary | ICD-10-CM | POA: Diagnosis present

## 2020-06-24 DIAGNOSIS — E559 Vitamin D deficiency, unspecified: Secondary | ICD-10-CM | POA: Diagnosis not present

## 2020-06-24 DIAGNOSIS — M6250 Muscle wasting and atrophy, not elsewhere classified, unspecified site: Secondary | ICD-10-CM | POA: Diagnosis present

## 2020-06-24 DIAGNOSIS — F32A Depression, unspecified: Secondary | ICD-10-CM | POA: Diagnosis present

## 2020-06-24 DIAGNOSIS — W2201XA Walked into wall, initial encounter: Secondary | ICD-10-CM | POA: Diagnosis present

## 2020-06-24 DIAGNOSIS — R52 Pain, unspecified: Secondary | ICD-10-CM | POA: Diagnosis not present

## 2020-06-24 DIAGNOSIS — S32511A Fracture of superior rim of right pubis, initial encounter for closed fracture: Secondary | ICD-10-CM | POA: Diagnosis not present

## 2020-06-24 DIAGNOSIS — W19XXXA Unspecified fall, initial encounter: Secondary | ICD-10-CM | POA: Diagnosis not present

## 2020-06-24 DIAGNOSIS — Z885 Allergy status to narcotic agent status: Secondary | ICD-10-CM

## 2020-06-24 DIAGNOSIS — Z96649 Presence of unspecified artificial hip joint: Secondary | ICD-10-CM

## 2020-06-24 DIAGNOSIS — W1830XA Fall on same level, unspecified, initial encounter: Secondary | ICD-10-CM | POA: Diagnosis present

## 2020-06-24 DIAGNOSIS — Z880 Allergy status to penicillin: Secondary | ICD-10-CM

## 2020-06-24 DIAGNOSIS — G709 Myoneural disorder, unspecified: Secondary | ICD-10-CM | POA: Diagnosis present

## 2020-06-24 DIAGNOSIS — Z7982 Long term (current) use of aspirin: Secondary | ICD-10-CM

## 2020-06-24 DIAGNOSIS — R296 Repeated falls: Secondary | ICD-10-CM | POA: Diagnosis present

## 2020-06-24 DIAGNOSIS — Z79899 Other long term (current) drug therapy: Secondary | ICD-10-CM

## 2020-06-24 LAB — CBC WITH DIFFERENTIAL/PLATELET
Abs Immature Granulocytes: 0.1 10*3/uL — ABNORMAL HIGH (ref 0.00–0.07)
Basophils Absolute: 0.1 10*3/uL (ref 0.0–0.1)
Basophils Relative: 1 %
Eosinophils Absolute: 0.1 10*3/uL (ref 0.0–0.5)
Eosinophils Relative: 1 %
HCT: 43.2 % (ref 39.0–52.0)
Hemoglobin: 14.1 g/dL (ref 13.0–17.0)
Immature Granulocytes: 1 %
Lymphocytes Relative: 10 %
Lymphs Abs: 0.9 10*3/uL (ref 0.7–4.0)
MCH: 32.6 pg (ref 26.0–34.0)
MCHC: 32.6 g/dL (ref 30.0–36.0)
MCV: 100 fL (ref 80.0–100.0)
Monocytes Absolute: 0.7 10*3/uL (ref 0.1–1.0)
Monocytes Relative: 7 %
Neutro Abs: 7.8 10*3/uL — ABNORMAL HIGH (ref 1.7–7.7)
Neutrophils Relative %: 80 %
Platelets: 168 10*3/uL (ref 150–400)
RBC: 4.32 MIL/uL (ref 4.22–5.81)
RDW: 13.2 % (ref 11.5–15.5)
WBC: 9.7 10*3/uL (ref 4.0–10.5)
nRBC: 0 % (ref 0.0–0.2)

## 2020-06-24 LAB — RESP PANEL BY RT-PCR (FLU A&B, COVID) ARPGX2
Influenza A by PCR: NEGATIVE
Influenza B by PCR: NEGATIVE
SARS Coronavirus 2 by RT PCR: NEGATIVE

## 2020-06-24 LAB — COMPREHENSIVE METABOLIC PANEL
ALT: 35 U/L (ref 0–44)
AST: 36 U/L (ref 15–41)
Albumin: 3.5 g/dL (ref 3.5–5.0)
Alkaline Phosphatase: 51 U/L (ref 38–126)
Anion gap: 9 (ref 5–15)
BUN: 9 mg/dL (ref 8–23)
CO2: 28 mmol/L (ref 22–32)
Calcium: 9.2 mg/dL (ref 8.9–10.3)
Chloride: 104 mmol/L (ref 98–111)
Creatinine, Ser: 1.07 mg/dL (ref 0.61–1.24)
GFR, Estimated: >60 mL/min (ref >60)
Glucose, Bld: 94 mg/dL (ref 70–99)
Potassium: 3.8 mmol/L (ref 3.5–5.1)
Sodium: 141 mmol/L (ref 135–145)
Total Bilirubin: 0.5 mg/dL (ref 0.3–1.2)
Total Protein: 6.5 g/dL (ref 6.5–8.1)

## 2020-06-24 LAB — CBC
HCT: 42.9 % (ref 39.0–52.0)
Hemoglobin: 14.3 g/dL (ref 13.0–17.0)
MCH: 32.5 pg (ref 26.0–34.0)
MCHC: 33.3 g/dL (ref 30.0–36.0)
MCV: 97.5 fL (ref 80.0–100.0)
Platelets: 148 10*3/uL — ABNORMAL LOW (ref 150–400)
RBC: 4.4 MIL/uL (ref 4.22–5.81)
RDW: 13.2 % (ref 11.5–15.5)
WBC: 11.9 10*3/uL — ABNORMAL HIGH (ref 4.0–10.5)
nRBC: 0 % (ref 0.0–0.2)

## 2020-06-24 LAB — CREATININE, SERUM
Creatinine, Ser: 1.01 mg/dL (ref 0.61–1.24)
GFR, Estimated: >60 mL/min (ref >60)

## 2020-06-24 MED ORDER — ONDANSETRON HCL 4 MG/2ML IJ SOLN
4.0000 mg | Freq: Four times a day (QID) | INTRAMUSCULAR | Status: DC | PRN
  Administered 2020-06-24: 4 mg via INTRAVENOUS

## 2020-06-24 MED ORDER — ACETAMINOPHEN 325 MG PO TABS
650.0000 mg | ORAL_TABLET | Freq: Four times a day (QID) | ORAL | Status: DC | PRN
  Administered 2020-06-25 (×2): 650 mg via ORAL
  Filled 2020-06-24 (×2): qty 2

## 2020-06-24 MED ORDER — ONDANSETRON HCL 4 MG PO TABS: 4.0000 mg | ORAL_TABLET | Freq: Four times a day (QID) | ORAL | Status: DC | PRN

## 2020-06-24 MED ORDER — ENOXAPARIN SODIUM 40 MG/0.4ML ~~LOC~~ SOLN
40.0000 mg | SUBCUTANEOUS | Status: DC
  Administered 2020-06-24 – 2020-06-28 (×5): 40 mg via SUBCUTANEOUS
  Filled 2020-06-24 (×5): qty 0.4

## 2020-06-24 MED ORDER — SODIUM CHLORIDE 0.9 % IV SOLN: INTRAVENOUS | Status: DC

## 2020-06-24 MED ORDER — LORAZEPAM 2 MG/ML IJ SOLN
1.0000 mg | Freq: Once | INTRAMUSCULAR | Status: AC
  Administered 2020-06-24: 1 mg via INTRAVENOUS
  Filled 2020-06-24: qty 1

## 2020-06-24 MED ORDER — ACETAMINOPHEN 650 MG RE SUPP: 650.0000 mg | Freq: Four times a day (QID) | RECTAL | Status: DC | PRN

## 2020-06-24 MED ORDER — KETOROLAC TROMETHAMINE 15 MG/ML IJ SOLN
15.0000 mg | Freq: Four times a day (QID) | INTRAMUSCULAR | Status: DC | PRN
  Administered 2020-06-25: 15 mg via INTRAVENOUS
  Filled 2020-06-24: qty 1

## 2020-06-24 MED ORDER — HYDROCODONE-ACETAMINOPHEN 5-325 MG PO TABS: 1.0000 | ORAL_TABLET | ORAL | Status: DC | PRN

## 2020-06-24 MED ORDER — ONDANSETRON HCL 4 MG/2ML IJ SOLN
INTRAMUSCULAR | Status: AC
  Filled 2020-06-24: qty 2

## 2020-06-24 NOTE — ED Notes (Signed)
Patient transported to CT 

## 2020-06-24 NOTE — ED Notes (Signed)
Pt returned to 025

## 2020-06-24 NOTE — Discharge Instructions (Addendum)
Orthopaedic Trauma Service   Weightbear as tolerated Right leg with walker  Range of motion as tolerated Right hip and knee Slowly increase activity level Can use Ice or heat for pain/symptom relief as well (wrap ice or heat pack in towel so it is not directly on your skin, remove after 15 min )  Follow up with orthopaedics in 3 weeks Call [phone] for appointment

## 2020-06-24 NOTE — H&P (Signed)
History and Physical   Christopher Wheeler UTM:[id number] DOB: [birth year] DOA: 06/24/2020  Referring MD/NP/PA: Dr. Karle Starch  PCP: Carollee Herter, Alferd Apa, DO   Outpatient Specialists: None  Patient coming from: Home  Chief Complaint: Fall with hip pain  HPI: Christopher Wheeler is a 74 y.o. male with medical history significant of osteoporosis especially affecting the hips, chronic pain and chronic neuromuscular disorder, history of mononeuritis multiplex, hypertension, coronary artery disease, hyperlipidemia who sustained a mechanical fall at home apparently.  He landed on his right hip.  He apparently needed pain at the time.  Patient has no close association of family around.  He lives by himself.  He has noted progressive weakness lately.  He feels he may not be able to take care of himself anymore at home.  In the ER patient evaluated and found to have pelvic rami fractures.  Nondisplaced.  Nonsurgical fracture.  He will need mainly pain medication PT OT and supportive care.  He is being admitted to the medical service for the purpose.  He denied any injury to any other place in the head injury did not pass out.  No nausea vomiting or diarrhea..  ED Course: Vitals and labs are completely within normal.  Chest x-ray showed no acute findings.  X-ray of the hips showed acute nondisplaced fractures involving the right inferior and superior pubic rami.  Also degenerative changes of the bones.  Patient being admitted for pain management and supportive care for pelvic fractures  Review of Systems: As per HPI otherwise 10 point review of systems negative.    Past Medical History:  Diagnosis Date  . Arthritis    osteoarthritis-hips  . Chronic pain disorder 07-09-12   under chronic pain management through [place]   . Neuromuscular disorder ([place]) 07-09-12   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, C4 lesion)-right side muscle wasting    Past Surgical History:  Procedure  Laterality Date  . COLONOSCOPY W/ POLYPECTOMY  2016   [place] [facility]  . NEPHRECTOMY Left    partial L nephrectomy   . TOTAL HIP ARTHROPLASTY  07/21/2012   Procedure: TOTAL HIP ARTHROPLASTY ANTERIOR APPROACH;  Surgeon: Mauri Pole, MD;  Location: [facility];  Service: Orthopedics;  Laterality: Left;     reports that he quit smoking about 19 years ago. His smoking use included cigarettes and pipe. He has never used smokeless tobacco. He reports that he does not drink alcohol and does not use drugs.  Allergies  Allergen Reactions  . Cefazolin Anaphylaxis    Refractory hypotension and rash intraoperatively, cefazolin vs rocuronium  . Cephalosporins Anaphylaxis  . Rocuronium Anaphylaxis    Refractory hypotension and rash intraoperatively, rocuronium vs cefazolin  . Methadone     respiration and blood pressure depressed   . Levofloxacin Other (See Comments)    Per patient:"Possible Permanent Nerve Damage"  . Morphine And Related     respiration and blood pressure depressed   . Other     Potato: coughing Hops: coughing  . Simvastatin Nausea And Vomiting  . Temazepam Other (See Comments)    Excessive sleepiness  . Doxycycline Rash  . Erythromycin Rash  . Penicillins Rash and Other (See Comments)    Anaphylaxis    Family History  Problem Relation Age of Onset  . Diabetes Sister   . Diabetes Mellitus II Paternal Grandmother      Prior to Admission medications   Medication Sig Start Date End Date Taking? Authorizing Provider  alendronate (FOSAMAX) 70 MG tablet Take 70 mg by mouth once a week. Take with a full glass of water on an empty stomach.    [provider]  aspirin EC 81 MG tablet Take 81 mg by mouth daily.    [provider]  atorvastatin (LIPITOR) 40 MG tablet Take 40 mg by mouth daily.    [provider]  B Complex-C (SUPER B COMPLEX PO) Take 1 tablet by mouth daily.    [provider]  calcium carbonate (TUMS - DOSED IN MG ELEMENTAL  CALCIUM) 500 MG chewable tablet Chew 1 tablet by mouth 2 (two) times daily.    [provider]  diphenhydrAMINE (BENADRYL) 25 mg capsule Take 50 mg by mouth at bedtime as needed for sleep.    [provider]  docusate sodium (COLACE) 100 MG capsule Take 100 mg by mouth 3 (three) times daily. Patient takes this reguarly    [provider]  DULoxetine (CYMBALTA) 30 MG capsule Take 30 mg by mouth daily.    [provider]  fish oil-omega-3 fatty acids 1000 MG capsule Take 2 g by mouth 3 (three) times daily.    [provider]  gabapentin (NEURONTIN) 300 MG capsule Take 600 mg by mouth 3 (three) times daily.    [provider]  Garlic Oil 3244 MG CAPS Take 1,000 mg by mouth daily.     [provider]  ketoconazole (NIZORAL) 2 % cream Apply 1 application topically as needed for irritation. Apply to affected area.    [provider]  ketoconazole (NIZORAL) 2 % shampoo Apply 1 application topically 3 (three) times a week.    [provider]  Lactobacillus CHEW Chew 2 tablets by mouth 2 (two) times daily.    [provider]  Methylsulfonylmethane 1000 MG CAPS Take 1,000 mg by mouth. Take 3 caps daily    [provider]  metoprolol tartrate (LOPRESSOR) 25 MG tablet Take 12.5 mg by mouth 2 (two) times daily.     [provider]  Multiple Vitamin (MULTIVITAMIN WITH MINERALS) TABS Take 1 tablet by mouth daily.    [provider]  OVER THE COUNTER MEDICATION Take 1,000 capsules by mouth 3 (three) times daily. Methyl-sulfonyl-methane    [provider]  oxyCODONE (OXY IR/ROXICODONE) 5 MG immediate release tablet Take 5 mg by mouth every 4 (four) hours as needed for severe pain.    [provider]  Oxycodone HCl 10 MG TABS Take 10 mg by mouth. Per pt taking 3 times a day    [provider]  POLYETHYLENE GLYCOL 3350 PO Take 25 mg by mouth at bedtime.    [provider]  senna (SENOKOT) 8.6 MG tablet Take 4 tablets by mouth 2 (two) times daily. Patient takes this regularly    [provider]  tiZANidine (ZANAFLEX) 4 MG capsule Take by mouth.    [provider]  Valerian CAPS Take 3 capsules by mouth daily. 1605 mg capsules    [provider]  Vitamin D, Cholecalciferol, 1000 units CAPS Take 1,000 Units by mouth 5 (five) times daily.    [provider]    Physical Exam: Vitals:   06/24/20 1930 06/24/20 2000 06/24/20 2030 06/24/20 2100  BP: 126/78 133/73 125/73 126/70  Pulse: 72 81 78 92  Resp: 10 14 16 17   Temp:      TempSrc:      SpO2: 96% 97% 97% 100%  Weight:  Height:          Constitutional: Chronically ill looking, weak no distress, hard of hearing Vitals:   06/24/20 1930 06/24/20 2000 06/24/20 2030 06/24/20 2100  BP: 126/78 133/73 125/73 126/70  Pulse: 72 81 78 92  Resp: 10 14 16 17   Temp:      TempSrc:      SpO2: 96% 97% 97% 100%  Weight:      Height:       Eyes: PERRL, lids and conjunctivae normal ENMT: Mucous membranes are dry. Posterior pharynx clear of any exudate or lesions.Normal dentition.  Neck: normal, supple, no masses, no thyromegaly Respiratory: clear to auscultation bilaterally, no wheezing, no crackles. Normal respiratory effort. No accessory muscle use.  Cardiovascular: Regular rate and rhythm, no murmurs / rubs / gallops. No extremity edema. 2+ pedal pulses. No carotid bruits.  Abdomen: no tenderness, no masses palpated. No hepatosplenomegaly. Bowel sounds positive.  Musculoskeletal: no clubbing / cyanosis. No joint deformity upper and lower extremities. Good ROM, no contractures. Normal muscle tone.  Normal frame with tenderness on the right hip area Skin: no rashes, lesions, ulcers. No induration Neurologic: CN 2-12 grossly intact. Sensation intact, DTR normal. Strength 5/5 in all 4.  Psychiatric: Normal judgment and insight. Alert and oriented x 3. Normal mood.      Labs on Admission: I have personally reviewed following labs and imaging studies  CBC: Recent Labs  Lab 06/24/20 1750  WBC 9.7  NEUTROABS 7.8*  HGB 14.1  HCT 43.2  MCV 100.0  PLT 053   Basic Metabolic Panel: Recent Labs  Lab 06/24/20 1750  NA 141  K 3.8  CL 104  CO2 28  GLUCOSE 94  BUN 9  CREATININE 1.07  CALCIUM 9.2   GFR: Estimated Creatinine Clearance: 76.6 mL/min (by C-G formula based on SCr of 1.07 mg/dL). Liver Function Tests: Recent Labs  Lab 06/24/20 1750  AST 36  ALT 35  ALKPHOS 51  BILITOT 0.5  PROT 6.5  ALBUMIN 3.5   No results for input(s): LIPASE, AMYLASE in the last 168 hours. No results for input(s): AMMONIA in the last 168 hours. Coagulation Profile: No results for input(s): INR, PROTIME in the last 168 hours. Cardiac Enzymes: No results for input(s): CKTOTAL, CKMB, CKMBINDEX, TROPONINI in the last 168 hours. BNP (last 3 results) No results for input(s): PROBNP in the last 8760 hours. HbA1C: No results for input(s): HGBA1C in the last 72 hours. CBG: No results for input(s): GLUCAP in the last 168 hours. Lipid Profile: No results for input(s): CHOL, HDL, LDLCALC, TRIG, CHOLHDL, LDLDIRECT in the last 72 hours. Thyroid Function Tests: No results for input(s): TSH, T4TOTAL, FREET4, T3FREE, THYROIDAB in the last 72 hours. Anemia Panel: No results for input(s): VITAMINB12, FOLATE, FERRITIN, TIBC, IRON, RETICCTPCT in the last 72 hours. Urine analysis:    Component Value Date/Time   COLORURINE YELLOW 07/09/2012 1427   APPEARANCEUR CLEAR 07/09/2012 1427   LABSPEC 1.021 07/09/2012 1427   PHURINE 6.0 07/09/2012 1427   GLUCOSEU NEGATIVE 07/09/2012 1427   HGBUR NEGATIVE 07/09/2012 1427   BILIRUBINUR NEGATIVE 07/09/2012 1427   KETONESUR NEGATIVE 07/09/2012 1427   PROTEINUR NEGATIVE 07/09/2012 1427   UROBILINOGEN 0.2 07/09/2012 1427   NITRITE NEGATIVE 07/09/2012 1427   LEUKOCYTESUR NEGATIVE 07/09/2012 1427   Sepsis  Labs: @LABRCNTIP (procalcitonin:4,lacticidven:4) ) Recent Results (from the past 240 hour(s))  Resp Panel by RT-PCR (Flu A&B, Covid) Nasopharyngeal Swab     Status: None   Collection Time: 06/24/20  5:50 PM  Specimen: Nasopharyngeal Swab; Nasopharyngeal(NP) swabs in vial transport medium  Result Value Ref Range Status   SARS Coronavirus 2 by RT PCR NEGATIVE NEGATIVE Final    Comment: (NOTE) SARS-CoV-2 target nucleic acids are NOT DETECTED.  The SARS-CoV-2 RNA is generally detectable in upper respiratory specimens during the acute phase of infection. The lowest concentration of SARS-CoV-2 viral copies this assay can detect is 138 copies/mL. A negative result does not preclude SARS-Cov-2 infection and should not be used as the sole basis for treatment or other patient management decisions. A negative result may occur with  improper specimen collection/handling, submission of specimen other than nasopharyngeal swab, presence of viral mutation(s) within the areas targeted by this assay, and inadequate number of viral copies(<138 copies/mL). A negative result must be combined with clinical observations, patient history, and epidemiological information. The expected result is Negative.  Fact Sheet for Patients:  EntrepreneurPulse.com.au  Fact Sheet for Healthcare Providers:  IncredibleEmployment.be  This test is no t yet approved or cleared by the [place] FDA and  has been authorized for detection and/or diagnosis of SARS-CoV-2 by FDA under an Emergency Use Authorization (EUA). This EUA will remain  in effect (meaning this test can be used) for the duration of the COVID-19 declaration under Section 564(b)(1) of the Act, 21 U.S.C.section 360bbb-3(b)(1), unless the authorization is terminated  or revoked sooner.       Influenza A by PCR NEGATIVE NEGATIVE Final   Influenza B by PCR NEGATIVE NEGATIVE Final    Comment: (NOTE) The Xpert Xpress  SARS-CoV-2/FLU/RSV plus assay is intended as an aid in the diagnosis of influenza from Nasopharyngeal swab specimens and should not be used as a sole basis for treatment. Nasal washings and aspirates are unacceptable for Xpert Xpress SARS-CoV-2/FLU/RSV testing.  Fact Sheet for Patients: EntrepreneurPulse.com.au  Fact Sheet for Healthcare Providers: IncredibleEmployment.be  This test is not yet approved or cleared by the [place] FDA and has been authorized for detection and/or diagnosis of SARS-CoV-2 by FDA under an Emergency Use Authorization (EUA). This EUA will remain in effect (meaning this test can be used) for the duration of the COVID-19 declaration under Section 564(b)(1) of the Act, 21 U.S.C. section 360bbb-3(b)(1), unless the authorization is terminated or revoked.  Performed at [facility], [place] [address], [place], [place] [address]      Radiological Exams on Admission: DG Chest 2 View  Result Date: 06/24/2020 CLINICAL DATA:  Status post fall. EXAM: CHEST - 2 VIEW COMPARISON:  None. FINDINGS: There is no evidence of acute infiltrate, pleural effusion or pneumothorax. The heart size and mediastinal contours are within normal limits. Degenerative changes seen throughout the thoracic spine. IMPRESSION: No active cardiopulmonary disease. Electronically Signed   By: Virgina Norfolk M.D.   On: 06/24/2020 18:33   CT Hip Right Wo Contrast  Result Date: 06/24/2020 CLINICAL DATA:  Hip trauma. EXAM: CT OF THE RIGHT HIP WITHOUT CONTRAST TECHNIQUE: Multidetector CT imaging of the right hip was performed according to the standard protocol. Multiplanar CT image reconstructions were also generated. COMPARISON:  None. FINDINGS: Bones/Joint/Cartilage There are advanced degenerative changes of the right femoroacetabular joint. There is no acute displaced fracture or dislocation involving the proximal right femur. There are acute, nondisplaced  fractures involving the right inferior and superior pubic rami. Ligaments Suboptimally assessed by CT. Muscles and Tendons There is no acute intramuscular abnormality. Soft tissues The visualized portions of the bladder are unremarkable. A right external iliac artery stent is noted. IMPRESSION: 1.  Acute, nondisplaced fractures involving the right inferior and superior pubic rami. 2. No acute displaced fracture or dislocation involving the proximal right femur. 3. Advanced degenerative changes of the right femoroacetabular joint. Electronically Signed   By: Constance Holster M.D.   On: 06/24/2020 20:49   DG Hip Unilat W or Wo Pelvis 2-3 Views Right  Result Date: 06/24/2020 CLINICAL DATA:  Status post fall. EXAM: DG HIP (WITH OR WITHOUT PELVIS) 2-3V RIGHT COMPARISON:  None. FINDINGS: There is no evidence of an acute hip fracture or dislocation. An intact total left hip replacement is seen. Marked severity degenerative changes are seen involving the right hip, in the form of joint space narrowing and acetabular sclerosis. A radiopaque vascular stent is seen within the abdominal aorta and bilateral common iliac arteries. IMPRESSION: 1.  Marked severity degenerative changes of the right hip. 2. Intact left hip replacement. Electronically Signed   By: Virgina Norfolk M.D.   On: 06/24/2020 18:32      Assessment/Plan Principal Problem:   Pelvic fracture (HCC) Active Problems:   S/P left THA, AA   Osteoarthritis, hip, bilateral   Neuromuscular disorder (HCC)   CAD (coronary artery disease)   Essential hypertension   Age-related osteoporosis without current pathological fracture   Dyslipidemia     #1 right inferior and superior pubic rami fractures: Nonsurgical.  Patient will be admitted for observation.  Get PT and OT consultation.  Pain management.  Patient may require skilled facility placement for rehab.  #2 osteoporosis of recurrent falls: Patient is high risk for staying home.  We will  consider placement at skilled facility if recommended by PT and OT  #3 coronary artery disease, stable no decompensation  #4 essential hypertension: Continue home blood pressure medications.  #5 hyperlipidemia: Resume statin once confirmed.   DVT prophylaxis: Lovenox Code Status: Full code Family Communication: No family at bedside Disposition Plan: Home Consults called: None Admission status: Observation  Severity of Illness: The appropriate patient status for this patient is OBSERVATION. Observation status is judged to be reasonable and necessary in order to provide the required intensity of service to ensure the patient's safety. The patient's presenting symptoms, physical exam findings, and initial radiographic and laboratory data in the context of their medical condition is felt to place them at decreased risk for further clinical deterioration. Furthermore, it is anticipated that the patient will be medically stable for discharge from the hospital within 2 midnights of admission. The following factors support the patient status of observation.   " The patient's presenting symptoms include fall. " The physical exam findings include pain with tenderness in the hip area. " The initial radiographic and laboratory data are x-rays confirming pelvic fractures:Marland Kitchen     Barbette Merino MD [facility] Pager [phone]  If 7PM-7AM, please contact night-coverage [email or web address] Password [facility]  06/24/2020, 9:53 PM

## 2020-06-24 NOTE — ED Triage Notes (Signed)
BIB EMS. Pt reports "my feet got tangled up" no LOC, not on blood thinner. The right hip hit the ground, pt unable to bear weight on right side. Hx of left hip surgery. No pain in left hip.

## 2020-06-24 NOTE — ED Notes (Signed)
Charlotte Crumb, brother, [phone] would like an update when available

## 2020-06-24 NOTE — ED Notes (Signed)
Pt refusing to go to CT due to extreme claustrophobia. Req anxiety medication. Will alert provider.

## 2020-06-24 NOTE — ED Provider Notes (Signed)
[facility] EMERGENCY DEPARTMENT Provider Note   CSN: [id number] Arrival date & time: 06/24/20  1726     History Chief Complaint  Patient presents with  . Fall    Christopher Wheeler is a 74 y.o. male.  Pt reports he fell and landed on right hip.  Pt reports he can not stand on right leg.  No impact of head, no loss of consciousness.  Pt denies any other area of injury  The history is provided by the patient. No language interpreter was used.  Fall This is a new problem. The problem occurs constantly. The problem has been gradually worsening. Pertinent negatives include no chest pain and no abdominal pain. Nothing aggravates the symptoms. Nothing relieves the symptoms. He has tried nothing for the symptoms. The treatment provided no relief.       Past Medical History:  Diagnosis Date  . Arthritis    osteoarthritis-hips  . Chronic pain disorder 07-09-12   under chronic pain management through [place]   . Neuromuscular disorder ([place]) 07-09-12   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, C4 lesion)-right side muscle wasting    Patient Active Problem List   Diagnosis Date Noted  . Essential hypertension 02/15/2020  . Age-related osteoporosis without current pathological fracture 02/15/2020  . Dyslipidemia 02/15/2020  . Osteopenia 02/08/2019  . Chronic pain 11/26/2017  . CAD (coronary artery disease) 11/26/2017  . Atherosclerosis of aorta ([place]) 11/24/2017  . Aneurysm, common iliac artery ([place]) 11/24/2017  . Renal cell carcinoma ([place]) 11/24/2017  . Osteoarthritis, hip, bilateral 11/21/2016  . Expected blood loss anemia 07/22/2012  . Overweight (BMI 25.0-29.9) 07/22/2012  . S/P left THA, AA 07/21/2012  . Neuromuscular disorder ([place]) 07/09/2012    Past Surgical History:  Procedure Laterality Date  . COLONOSCOPY W/ POLYPECTOMY  [place] [facility]  . NEPHRECTOMY Left    partial L nephrectomy   . TOTAL HIP ARTHROPLASTY   07/21/2012   Procedure: TOTAL HIP ARTHROPLASTY ANTERIOR APPROACH;  Surgeon: Mauri Pole, MD;  Location: [facility];  Service: Orthopedics;  Laterality: Left;       Family History  Problem Relation Age of Onset  . Diabetes Sister   . Diabetes Mellitus II Paternal Grandmother     Social History   Tobacco Use  . Smoking status: Former Smoker    Types: Cigarettes, Pipe    Quit date: 07/09/2000    Years since quitting: 19.9  . Smokeless tobacco: Never Used  Substance Use Topics  . Alcohol use: No    Comment: 1'2007-none since pain management tx.  . Drug use: No    Home Medications Prior to Admission medications   Medication Sig Start Date End Date Taking? Authorizing Provider  alendronate (FOSAMAX) 70 MG tablet Take 70 mg by mouth once a week. Take with a full glass of water on an empty stomach.    [provider]  aspirin EC 81 MG tablet Take 81 mg by mouth daily.    [provider]  atorvastatin (LIPITOR) 40 MG tablet Take 40 mg by mouth daily.    [provider]  B Complex-C (SUPER B COMPLEX PO) Take 1 tablet by mouth daily.    [provider]  calcium carbonate (TUMS - DOSED IN MG ELEMENTAL CALCIUM) 500 MG chewable tablet Chew 1 tablet by mouth 2 (two) times daily.    [provider]  diphenhydrAMINE (BENADRYL) 25 mg capsule Take 50 mg by mouth at bedtime  as needed for sleep.    [provider]  docusate sodium (COLACE) 100 MG capsule Take 100 mg by mouth 3 (three) times daily. Patient takes this reguarly    [provider]  DULoxetine (CYMBALTA) 30 MG capsule Take 30 mg by mouth daily.    [provider]  fish oil-omega-3 fatty acids 1000 MG capsule Take 2 g by mouth 3 (three) times daily.    [provider]  gabapentin (NEURONTIN) 300 MG capsule Take 600 mg by mouth 3 (three) times daily.    [provider]  Garlic Oil 1191 MG CAPS Take 1,000 mg by mouth daily.     [provider]   ketoconazole (NIZORAL) 2 % cream Apply 1 application topically as needed for irritation. Apply to affected area.    [provider]  ketoconazole (NIZORAL) 2 % shampoo Apply 1 application topically 3 (three) times a week.    [provider]  Lactobacillus CHEW Chew 2 tablets by mouth 2 (two) times daily.    [provider]  Methylsulfonylmethane 1000 MG CAPS Take 1,000 mg by mouth. Take 3 caps daily    [provider]  metoprolol tartrate (LOPRESSOR) 25 MG tablet Take 12.5 mg by mouth 2 (two) times daily.     [provider]  Multiple Vitamin (MULTIVITAMIN WITH MINERALS) TABS Take 1 tablet by mouth daily.    [provider]  OVER THE COUNTER MEDICATION Take 1,000 capsules by mouth 3 (three) times daily. Methyl-sulfonyl-methane    [provider]  oxyCODONE (OXY IR/ROXICODONE) 5 MG immediate release tablet Take 5 mg by mouth every 4 (four) hours as needed for severe pain.    [provider]  Oxycodone HCl 10 MG TABS Take 10 mg by mouth. Per pt taking 3 times a day    [provider]  POLYETHYLENE GLYCOL 3350 PO Take 25 mg by mouth at bedtime.    [provider]  senna (SENOKOT) 8.6 MG tablet Take 4 tablets by mouth 2 (two) times daily. Patient takes this regularly    [provider]  tiZANidine (ZANAFLEX) 4 MG capsule Take by mouth.    [provider]  Valerian CAPS Take 3 capsules by mouth daily. 1605 mg capsules    [provider]  Vitamin D, Cholecalciferol, 1000 units CAPS Take 1,000 Units by mouth 5 (five) times daily.    [provider]    Allergies    Cefazolin, Cephalosporins, Rocuronium, Methadone, Levofloxacin, Morphine and related, Other, Simvastatin, Temazepam, Doxycycline, Erythromycin, and Penicillins  Review of Systems   Review of Systems  Cardiovascular: Negative for chest pain.  Gastrointestinal: Negative for abdominal pain.  All other systems reviewed  and are negative.   Physical Exam Updated Vital Signs Pulse 67   Temp 98.2 F (36.8 C) (Oral)   Resp 14   Ht 6\' 4"  (1.93 m)   Wt 88 kg   SpO2 97%   BMI 23.61 kg/m   Physical Exam Vitals and nursing note reviewed.  Constitutional:      Appearance: He is well-developed.  HENT:     Head: Normocephalic and atraumatic.  Eyes:     Conjunctiva/sclera: Conjunctivae normal.  Cardiovascular:     Rate and Rhythm: Normal rate and regular rhythm.  Pulmonary:     Effort: Pulmonary effort is normal. No respiratory distress.     Breath sounds: Normal breath sounds.  Abdominal:     Palpations: Abdomen is soft.     Tenderness:  There is no abdominal tenderness.  Musculoskeletal:        General: Tenderness present.     Cervical back: Neck supple.     Comments: Tender right hip  Skin:    General: Skin is warm and dry.  Neurological:     General: No focal deficit present.     Mental Status: He is alert.  Psychiatric:        Mood and Affect: Mood normal.     ED Results / Procedures / Treatments   Labs (all labs ordered are listed, but only abnormal results are displayed) Labs Reviewed  RESP PANEL BY RT-PCR (FLU A&B, COVID) ARPGX2  CBC WITH DIFFERENTIAL/PLATELET  COMPREHENSIVE METABOLIC PANEL    EKG None  Radiology No results found.  Procedures Procedures (including critical care time)  Medications Ordered in ED Medications - No data to display  ED Course  I have reviewed the triage vital signs and the nursing notes.  Pertinent labs & imaging results that were available during my care of the patient were reviewed by me and considered in my medical decision making (see chart for details).    MDM Rules/Calculators/A&P                          MDM:  Ct shows a pelvic fracture right superior and inferior fracture.   Pt counseled on injury.  Pt lives alone.  Pt reports no family to assist him. I spoke to Hospitalist who will admit.   Final Clinical Impression(s) / ED  Diagnoses Final diagnoses:  Closed nondisplaced fracture of pelvis, unspecified part of pelvis, initial encounter [facility])    Rx / [facility] Orders ED Discharge Orders    None       Sidney Ace 06/24/20 2150    Truddie Hidden, MD 06/24/20 [phone]

## 2020-06-24 NOTE — Plan of Care (Signed)

## 2020-06-24 NOTE — ED Notes (Signed)
Attempted report x1. Will call back in 15 min.

## 2020-06-24 NOTE — ED Notes (Signed)
Pt req nausea medicine due to feeling like he is going to vomit. Pt burping and given emesis bag.

## 2020-06-25 ENCOUNTER — Observation Stay (HOSPITAL_COMMUNITY): Payer: Medicare Other

## 2020-06-25 DIAGNOSIS — Z96642 Presence of left artificial hip joint: Secondary | ICD-10-CM | POA: Diagnosis not present

## 2020-06-25 DIAGNOSIS — W1830XA Fall on same level, unspecified, initial encounter: Secondary | ICD-10-CM | POA: Diagnosis present

## 2020-06-25 DIAGNOSIS — Z905 Acquired absence of kidney: Secondary | ICD-10-CM | POA: Diagnosis not present

## 2020-06-25 DIAGNOSIS — M1611 Unilateral primary osteoarthritis, right hip: Secondary | ICD-10-CM | POA: Diagnosis not present

## 2020-06-25 DIAGNOSIS — M160 Bilateral primary osteoarthritis of hip: Secondary | ICD-10-CM | POA: Diagnosis not present

## 2020-06-25 DIAGNOSIS — S32511A Fracture of superior rim of right pubis, initial encounter for closed fracture: Secondary | ICD-10-CM | POA: Diagnosis not present

## 2020-06-25 DIAGNOSIS — Z885 Allergy status to narcotic agent status: Secondary | ICD-10-CM | POA: Diagnosis not present

## 2020-06-25 DIAGNOSIS — M5416 Radiculopathy, lumbar region: Secondary | ICD-10-CM | POA: Diagnosis present

## 2020-06-25 DIAGNOSIS — M21371 Foot drop, right foot: Secondary | ICD-10-CM | POA: Diagnosis present

## 2020-06-25 DIAGNOSIS — W2201XA Walked into wall, initial encounter: Secondary | ICD-10-CM | POA: Diagnosis present

## 2020-06-25 DIAGNOSIS — G709 Myoneural disorder, unspecified: Secondary | ICD-10-CM | POA: Diagnosis not present

## 2020-06-25 DIAGNOSIS — Z9189 Other specified personal risk factors, not elsewhere classified: Secondary | ICD-10-CM

## 2020-06-25 DIAGNOSIS — G894 Chronic pain syndrome: Secondary | ICD-10-CM

## 2020-06-25 DIAGNOSIS — M2550 Pain in unspecified joint: Secondary | ICD-10-CM | POA: Diagnosis not present

## 2020-06-25 DIAGNOSIS — W19XXXA Unspecified fall, initial encounter: Secondary | ICD-10-CM

## 2020-06-25 DIAGNOSIS — S329XXS Fracture of unspecified parts of lumbosacral spine and pelvis, sequela: Secondary | ICD-10-CM | POA: Diagnosis not present

## 2020-06-25 DIAGNOSIS — I2510 Atherosclerotic heart disease of native coronary artery without angina pectoris: Secondary | ICD-10-CM | POA: Diagnosis not present

## 2020-06-25 DIAGNOSIS — M6250 Muscle wasting and atrophy, not elsewhere classified, unspecified site: Secondary | ICD-10-CM | POA: Diagnosis present

## 2020-06-25 DIAGNOSIS — Z888 Allergy status to other drugs, medicaments and biological substances status: Secondary | ICD-10-CM | POA: Diagnosis not present

## 2020-06-25 DIAGNOSIS — D62 Acute posthemorrhagic anemia: Secondary | ICD-10-CM | POA: Diagnosis present

## 2020-06-25 DIAGNOSIS — Z7982 Long term (current) use of aspirin: Secondary | ICD-10-CM | POA: Diagnosis not present

## 2020-06-25 DIAGNOSIS — S32810A Multiple fractures of pelvis with stable disruption of pelvic ring, initial encounter for closed fracture: Secondary | ICD-10-CM

## 2020-06-25 DIAGNOSIS — S329XXA Fracture of unspecified parts of lumbosacral spine and pelvis, initial encounter for closed fracture: Secondary | ICD-10-CM | POA: Diagnosis not present

## 2020-06-25 DIAGNOSIS — Y92009 Unspecified place in unspecified non-institutional (private) residence as the place of occurrence of the external cause: Secondary | ICD-10-CM | POA: Diagnosis not present

## 2020-06-25 DIAGNOSIS — M180 Bilateral primary osteoarthritis of first carpometacarpal joints: Secondary | ICD-10-CM | POA: Diagnosis not present

## 2020-06-25 DIAGNOSIS — S32591A Other specified fracture of right pubis, initial encounter for closed fracture: Secondary | ICD-10-CM | POA: Diagnosis not present

## 2020-06-25 DIAGNOSIS — R296 Repeated falls: Secondary | ICD-10-CM | POA: Diagnosis present

## 2020-06-25 DIAGNOSIS — Z833 Family history of diabetes mellitus: Secondary | ICD-10-CM | POA: Diagnosis not present

## 2020-06-25 DIAGNOSIS — S32501A Unspecified fracture of right pubis, initial encounter for closed fracture: Secondary | ICD-10-CM | POA: Diagnosis not present

## 2020-06-25 DIAGNOSIS — R531 Weakness: Secondary | ICD-10-CM | POA: Diagnosis present

## 2020-06-25 DIAGNOSIS — Z7401 Bed confinement status: Secondary | ICD-10-CM | POA: Diagnosis not present

## 2020-06-25 DIAGNOSIS — R5381 Other malaise: Secondary | ICD-10-CM | POA: Diagnosis not present

## 2020-06-25 DIAGNOSIS — F32A Depression, unspecified: Secondary | ICD-10-CM | POA: Diagnosis present

## 2020-06-25 DIAGNOSIS — Z96649 Presence of unspecified artificial hip joint: Secondary | ICD-10-CM | POA: Diagnosis not present

## 2020-06-25 DIAGNOSIS — I10 Essential (primary) hypertension: Secondary | ICD-10-CM | POA: Diagnosis not present

## 2020-06-25 DIAGNOSIS — E785 Hyperlipidemia, unspecified: Secondary | ICD-10-CM | POA: Diagnosis present

## 2020-06-25 DIAGNOSIS — Z87891 Personal history of nicotine dependence: Secondary | ICD-10-CM | POA: Diagnosis not present

## 2020-06-25 DIAGNOSIS — E559 Vitamin D deficiency, unspecified: Secondary | ICD-10-CM | POA: Diagnosis present

## 2020-06-25 DIAGNOSIS — M810 Age-related osteoporosis without current pathological fracture: Secondary | ICD-10-CM | POA: Diagnosis present

## 2020-06-25 DIAGNOSIS — S32810S Multiple fractures of pelvis with stable disruption of pelvic ring, sequela: Secondary | ICD-10-CM | POA: Diagnosis not present

## 2020-06-25 DIAGNOSIS — M1993 Secondary osteoarthritis, unspecified site: Secondary | ICD-10-CM | POA: Diagnosis not present

## 2020-06-25 DIAGNOSIS — Z79899 Other long term (current) drug therapy: Secondary | ICD-10-CM | POA: Diagnosis not present

## 2020-06-25 DIAGNOSIS — Z20822 Contact with and (suspected) exposure to covid-19: Secondary | ICD-10-CM | POA: Diagnosis present

## 2020-06-25 DIAGNOSIS — Z79891 Long term (current) use of opiate analgesic: Secondary | ICD-10-CM | POA: Diagnosis not present

## 2020-06-25 DIAGNOSIS — M8580 Other specified disorders of bone density and structure, unspecified site: Secondary | ICD-10-CM | POA: Diagnosis not present

## 2020-06-25 DIAGNOSIS — Z880 Allergy status to penicillin: Secondary | ICD-10-CM | POA: Diagnosis not present

## 2020-06-25 LAB — COMPREHENSIVE METABOLIC PANEL
ALT: 31 U/L (ref 0–44)
AST: 34 U/L (ref 15–41)
Albumin: 3.1 g/dL — ABNORMAL LOW (ref 3.5–5.0)
Alkaline Phosphatase: 48 U/L (ref 38–126)
Anion gap: 11 (ref 5–15)
BUN: 8 mg/dL (ref 8–23)
CO2: 24 mmol/L (ref 22–32)
Calcium: 8.8 mg/dL — ABNORMAL LOW (ref 8.9–10.3)
Chloride: 104 mmol/L (ref 98–111)
Creatinine, Ser: 1.02 mg/dL (ref 0.61–1.24)
GFR, Estimated: >60 mL/min (ref >60)
Glucose, Bld: 141 mg/dL — ABNORMAL HIGH (ref 70–99)
Potassium: 4 mmol/L (ref 3.5–5.1)
Sodium: 139 mmol/L (ref 135–145)
Total Bilirubin: 1 mg/dL (ref 0.3–1.2)
Total Protein: 5.7 g/dL — ABNORMAL LOW (ref 6.5–8.1)

## 2020-06-25 LAB — CBC
HCT: 39.6 % (ref 39.0–52.0)
Hemoglobin: 13 g/dL (ref 13.0–17.0)
MCH: 32.3 pg (ref 26.0–34.0)
MCHC: 32.8 g/dL (ref 30.0–36.0)
MCV: 98.5 fL (ref 80.0–100.0)
Platelets: 148 10*3/uL — ABNORMAL LOW (ref 150–400)
RBC: 4.02 MIL/uL — ABNORMAL LOW (ref 4.22–5.81)
RDW: 13.3 % (ref 11.5–15.5)
WBC: 9 10*3/uL (ref 4.0–10.5)
nRBC: 0 % (ref 0.0–0.2)

## 2020-06-25 LAB — TSH: TSH: 1.182 u[IU]/mL (ref 0.350–4.500)

## 2020-06-25 LAB — VITAMIN D 25 HYDROXY (VIT D DEFICIENCY, FRACTURES): Vit D, 25-Hydroxy: 54.21 ng/mL (ref 30–100)

## 2020-06-25 MED ORDER — VITAMIN D 25 MCG (1000 UNIT) PO TABS
1000.0000 [IU] | ORAL_TABLET | Freq: Every day | ORAL | Status: DC
  Administered 2020-06-25 – 2020-06-28 (×4): 1000 [IU] via ORAL
  Filled 2020-06-25 (×4): qty 1

## 2020-06-25 MED ORDER — SENNOSIDES-DOCUSATE SODIUM 8.6-50 MG PO TABS
1.0000 | ORAL_TABLET | Freq: Two times a day (BID) | ORAL | Status: DC
  Administered 2020-06-25 (×2): 1 via ORAL
  Filled 2020-06-25 (×2): qty 1

## 2020-06-25 MED ORDER — OXYCODONE HCL ER 10 MG PO T12A
10.0000 mg | EXTENDED_RELEASE_TABLET | Freq: Three times a day (TID) | ORAL | Status: DC
  Administered 2020-06-25 – 2020-06-28 (×11): 10 mg via ORAL
  Filled 2020-06-25 (×11): qty 1

## 2020-06-25 MED ORDER — DULOXETINE HCL 30 MG PO CPEP
30.0000 mg | ORAL_CAPSULE | Freq: Every day | ORAL | Status: DC
  Administered 2020-06-25 – 2020-06-28 (×4): 30 mg via ORAL
  Filled 2020-06-25 (×4): qty 1

## 2020-06-25 MED ORDER — ASPIRIN EC 81 MG PO TBEC
81.0000 mg | DELAYED_RELEASE_TABLET | Freq: Every day | ORAL | Status: DC
  Administered 2020-06-25 – 2020-06-28 (×4): 81 mg via ORAL
  Filled 2020-06-25 (×4): qty 1

## 2020-06-25 MED ORDER — GABAPENTIN 300 MG PO CAPS
600.0000 mg | ORAL_CAPSULE | Freq: Three times a day (TID) | ORAL | Status: DC
  Administered 2020-06-25 – 2020-06-28 (×12): 600 mg via ORAL
  Filled 2020-06-25 (×12): qty 2

## 2020-06-25 MED ORDER — VITAMIN D (CHOLECALCIFEROL) 25 MCG (1000 UT) PO CAPS: 1000.0000 [IU] | ORAL_CAPSULE | Freq: Every day | ORAL | Status: DC

## 2020-06-25 MED ORDER — ADULT MULTIVITAMIN W/MINERALS CH
1.0000 | ORAL_TABLET | Freq: Every day | ORAL | Status: DC
  Administered 2020-06-25 – 2020-06-28 (×4): 1 via ORAL
  Filled 2020-06-25 (×4): qty 1

## 2020-06-25 MED ORDER — ATORVASTATIN CALCIUM 40 MG PO TABS
40.0000 mg | ORAL_TABLET | Freq: Every day | ORAL | Status: DC
  Administered 2020-06-25 – 2020-06-28 (×4): 40 mg via ORAL
  Filled 2020-06-25 (×4): qty 1

## 2020-06-25 MED ORDER — TIZANIDINE HCL 4 MG PO TABS
4.0000 mg | ORAL_TABLET | Freq: Every day | ORAL | Status: DC
  Administered 2020-06-25 – 2020-06-28 (×4): 4 mg via ORAL
  Filled 2020-06-25 (×6): qty 1

## 2020-06-25 MED ORDER — ACETAMINOPHEN 500 MG PO TABS
1000.0000 mg | ORAL_TABLET | Freq: Three times a day (TID) | ORAL | Status: DC
  Administered 2020-06-25 – 2020-06-28 (×11): 1000 mg via ORAL
  Filled 2020-06-25 (×11): qty 2

## 2020-06-25 MED ORDER — POLYETHYLENE GLYCOL 3350 17 G PO PACK: 17.0000 g | PACK | Freq: Two times a day (BID) | ORAL | Status: DC | PRN

## 2020-06-25 MED ORDER — OXYCODONE HCL 5 MG PO TABS
10.0000 mg | ORAL_TABLET | Freq: Three times a day (TID) | ORAL | Status: DC
  Administered 2020-06-25: 10 mg via ORAL
  Filled 2020-06-25 (×3): qty 2

## 2020-06-25 MED ORDER — METOPROLOL TARTRATE 12.5 MG HALF TABLET
12.5000 mg | ORAL_TABLET | Freq: Two times a day (BID) | ORAL | Status: DC
  Administered 2020-06-25 – 2020-06-28 (×8): 12.5 mg via ORAL
  Filled 2020-06-25 (×8): qty 1

## 2020-06-25 MED ORDER — HYDROMORPHONE HCL 1 MG/ML IJ SOLN
0.5000 mg | INTRAMUSCULAR | Status: DC | PRN
  Administered 2020-06-25 – 2020-06-28 (×5): 0.5 mg via INTRAVENOUS
  Filled 2020-06-25 (×5): qty 0.5

## 2020-06-25 NOTE — Progress Notes (Signed)
Physical Therapy Treatment Patient Details Name: Christopher Wheeler MRN: [id number] DOB: Jul 16, 1946 Today's Date: 06/25/2020    History of Present Illness Christopher Wheeler is a 74 y.o. male with medical history significant of osteoporosis especially affecting the hips, chronic pain and chronic neuromuscular disorder, history of mononeuritis multiplex, hypertension, coronary artery disease, hyperlipidemia who sustained a mechanical fall at home apparently.  He landed on his right hip, sustaining found to have pelvic superior and inferior rami fractures, nondisplaced, nonsurgical fractures.     PT Comments    Continuing work on functional mobility and activity tolerance;  Session focused on initial transfer and gait training with pain meds on board, and he was able to walk approx 6 feet in room with RW for support; More painful with bed mobility, and he required Max assist of 2 for rolling and pushing up sidelying to sit, with lots of support of bil LEs; continue to recommend SNF for post-acute rehab to maximize independence and safety with mobility prior to dc home  Follow Up Recommendations  SNF     Equipment Recommendations  Rolling walker with 5" wheels;3in1 (PT) (tall)    Recommendations for Other Services       Precautions / Restrictions Precautions Precautions: Fall Restrictions Weight Bearing Restrictions: No    Mobility  Bed Mobility Overal bed mobility: Needs Assistance Bed Mobility: Rolling;Sidelying to Sit Rolling: +2 for physical assistance;Max assist Sidelying to sit: +2 for physical assistance;Max assist       General bed mobility comments: verbal cues for technique, assisted LEs over EOB and to raise trunk  Transfers Overall transfer level: Needs assistance Equipment used: Rolling walker (2 wheeled) Transfers: Sit to/from Stand Sit to Stand: Min assist;+2 safety/equipment         General transfer comment: verbal cues for technique, hand placement, and safety;  slow rise with heavy dependence on RW;  increased time  Ambulation/Gait Ambulation/Gait assistance: Min guard;+2 safety/equipment (chair push) Gait Distance (Feet): 6 Feet Assistive device: Rolling walker (2 wheeled) Gait Pattern/deviations: Step-to pattern;Antalgic     General Gait Details: Cues for sequencing and to use RW to unweigh painful RLE in stance   Stairs             Wheelchair Mobility    Modified Rankin (Stroke Patients Only)       Balance Overall balance assessment: Needs assistance   Sitting balance-Leahy Scale: Good       Standing balance-Leahy Scale: Poor Standing balance comment: reliant on B UE and external support                            Cognition Arousal/Alertness: Awake/alert Behavior During Therapy: Anxious Overall Cognitive Status: Within Functional Limits for tasks assessed                                        Exercises      General Comments General comments (skin integrity, edema, etc.): Limited by pain throughout this session, but able to talk through his daily functional needs and the need for post-acute rehab      Pertinent Vitals/Pain Pain Assessment: Faces Faces Pain Scale: Hurts even more Pain Location: RLE Pain Descriptors / Indicators: Grimacing Pain Intervention(s): Premedicated before session;Monitored during session    Home Living Family/patient expects to be discharged to:: Private residence Living Arrangements: Alone Available Help at Discharge:  Available PRN/intermittently Type of Home: House Home Access: Stairs to enter Entrance Stairs-Rails: None Home Layout: One level Home Equipment: Other (comment);Cane - single point;Bedside commode;Shower seat;Grab bars - tub/shower;Hand held Tourist information centre manager - 4 wheels;Walker - 2 wheels;Wheelchair - manual Additional Comments: built up shoe for L foot    Prior Function Level of Independence: Independent with assistive device(s)       Comments: drives, uses 4WW or cane    PT Goals (current goals can now be found in the care plan section) Acute Rehab PT Goals Patient Stated Goal: to be able to walk to eventually go home PT Goal Formulation: With patient Time For Goal Achievement: 07/09/20 Potential to Achieve Goals: Fair Progress towards PT goals: Progressing toward goals    Frequency    Min 3X/week      PT Plan Current plan remains appropriate;Other (comment) (Can pt get Inpt status to qualify him for SNF?)    Co-evaluation PT/OT/SLP Co-Evaluation/Treatment: Yes Reason for Co-Treatment: For patient/therapist safety;To address functional/ADL transfers PT goals addressed during session: Mobility/safety with mobility OT goals addressed during session: Proper use of Adaptive equipment and DME      AM-PAC PT "6 Clicks" Mobility   Outcome Measure  Help needed turning from your back to your side while in a flat bed without using bedrails?: A Lot Help needed moving from lying on your back to sitting on the side of a flat bed without using bedrails?: A Lot Help needed moving to and from a bed to a chair (including a wheelchair)?: A Lot Help needed standing up from a chair using your arms (e.g., wheelchair or bedside chair)?: A Little Help needed to walk in hospital room?: A Lot Help needed climbing 3-5 steps with a railing? : Total 6 Click Score: 12    End of Session Equipment Utilized During Treatment: Gait belt Activity Tolerance: Patient tolerated treatment well Patient left: in chair;with call bell/phone within reach;with chair alarm set Nurse Communication: Mobility status PT Visit Diagnosis: Other abnormalities of gait and mobility (R26.89);History of falling (Z91.81);Pain Pain - Right/Left: Right Pain - part of body: Hip (and groin)     Time: 1140-1200 PT Time Calculation (min) (ACUTE ONLY): 20 min  Charges:  $Gait Training: 8-22 mins                     Roney Marion, PT  Acute  Rehabilitation Services Pager [phone] Office [phone]    Christopher Wheeler 06/25/2020, 2:47 PM

## 2020-06-25 NOTE — Social Work (Signed)
CSW spoke to pt about SNF reccs. Pt refused to talk to [facility] stating that he is admitted under OBS and he would like to be discharged home in the AM.   Pt states he lives at home alone. PT did not want to discuss further.   Christopher Wheeler, Latanya Presser, [place] Social Worker [phone]

## 2020-06-25 NOTE — Evaluation (Signed)
Physical Therapy Evaluation Patient Details Name: Christopher Wheeler MRN: [id number] DOB: [birth year] Today's Date: 06/25/2020   History of Present Illness  Christopher Wheeler is a 74 y.o. male with medical history significant of osteoporosis especially affecting the hips, chronic pain and chronic neuromuscular disorder, history of mononeuritis multiplex, hypertension, coronary artery disease, hyperlipidemia who sustained a mechanical fall at home apparently.  He landed on his right hip, sustaining found to have pelvic superior and inferior rami fractures, nondisplaced, nonsurgical fractures.   Clinical Impression   Pt admitted with above diagnosis. Comes from home where he lives in a single level home with a few steps to enter; Lives alone and was managing independently with use of assistive devices prn; Friend/neighbor can assists intermittently; Presents to PT with significant pain limiting most aspects of functional mobility and ADLs; will need SNF for post-acute rehab to maximize independence and safety with mobility prior to getting home;  Pt currently with functional limitations due to the deficits listed below (see PT Problem List). Pt will benefit from skilled PT to increase their independence and safety with mobility to allow discharge to the venue listed below.    Planning to premedicate for pain, and hope to return for full evaluation of mobility     Follow Up Recommendations SNF    Equipment Recommendations  Rolling walker with 5" wheels;3in1 (PT) (tall)    Recommendations for Other Services       Precautions / Restrictions Precautions Precautions: Fall Restrictions Weight Bearing Restrictions: No      Mobility  Bed Mobility Overal bed mobility: Needs Assistance Bed Mobility: Rolling;Sidelying to Sit Rolling: +2 for physical assistance;Max assist Sidelying to sit: +2 for physical assistance;Max assist       General bed mobility comments: Pt hesitant to move at all prior to  having pain meds; Able to use the rolled sheet as a leg lifter to help his RLE back into bed and to a comfortable position    Transfers Overall transfer level: Needs assistance Equipment used: Rolling walker (2 wheeled) Transfers: Sit to/from Stand Sit to Stand: Min assist;+2 safety/equipment         General transfer comment: verbal cues for technique, increased time  Ambulation/Gait                Stairs            Wheelchair Mobility    Modified Rankin (Stroke Patients Only)       Balance Overall balance assessment: Needs assistance   Sitting balance-Leahy Scale: Good       Standing balance-Leahy Scale: Poor Standing balance comment: reliant on B UE and external support                             Pertinent Vitals/Pain Pain Assessment: Faces Faces Pain Scale: Hurts whole lot Pain Location: R LE with any movement (including placement of rolled sheet at foot to give him more control of RLE movement) Pain Descriptors / Indicators: Grimacing;Guarding Pain Intervention(s): Monitored during session    Home Living Family/patient expects to be discharged to:: Private residence Living Arrangements: Alone Available Help at Discharge: Available PRN/intermittently Type of Home: House Home Access: Stairs to enter Entrance Stairs-Rails: None Entrance Stairs-Number of Steps: 2 Home Layout: One level Home Equipment: Other (comment);Cane - single point;Bedside commode;Shower seat;Grab bars - tub/shower;Hand held Tourist information centre manager - 4 wheels;Walker - 2 wheels;Wheelchair - manual Additional Comments: built up shoe for L foot  Prior Function Level of Independence: Independent with assistive device(s)         Comments: drives, uses 4WW or cane      Hand Dominance   Dominant Hand: Right    Extremity/Trunk Assessment   Upper Extremity Assessment Upper Extremity Assessment: Defer to OT evaluation    Lower Extremity Assessment Lower  Extremity Assessment: RLE deficits/detail RLE Deficits / Details: Limited by pain at R groin with any movement of RLE, including placement of rolled sheet at his foot to give him more control; noted he is able to give himself active assisted ROM into hip and knee flexion while supine using the rolled sheet       Communication   Communication: No difficulties  Cognition Arousal/Alertness: Awake/alert Behavior During Therapy: Anxious Overall Cognitive Status: Within Functional Limits for tasks assessed                                        General Comments General comments (skin integrity, edema, etc.): Limited by pain throughout this session, but able to talk through his daily functional needs and the need for post-acute rehab    Exercises     Assessment/Plan    PT Assessment Patient needs continued PT services  PT Problem List Decreased strength;Decreased range of motion;Decreased activity tolerance;Decreased balance;Decreased mobility;Decreased coordination;Decreased knowledge of use of DME;Decreased safety awareness;Decreased knowledge of precautions;Pain       PT Treatment Interventions      PT Goals (Current goals can be found in the Care Plan section)  Acute Rehab PT Goals Patient Stated Goal: to be able to walk to eventually go home PT Goal Formulation: With patient Time For Goal Achievement: 07/09/20 Potential to Achieve Goals: Fair    Frequency Min 3X/week   Barriers to discharge Other (comment) (Observation status)      Co-evaluation PT/OT/SLP Co-Evaluation/Treatment: Yes Reason for Co-Treatment: For patient/therapist safety PT goals addressed during session: Mobility/safety with mobility OT goals addressed during session: Proper use of Adaptive equipment and DME       AM-PAC PT "6 Clicks" Mobility  Outcome Measure Help needed turning from your back to your side while in a flat bed without using bedrails?: A Lot Help needed moving from  lying on your back to sitting on the side of a flat bed without using bedrails?: A Lot Help needed moving to and from a bed to a chair (including a wheelchair)?: A Lot Help needed standing up from a chair using your arms (e.g., wheelchair or bedside chair)?: A Lot Help needed to walk in hospital room?: Total Help needed climbing 3-5 steps with a railing? : Total 6 Click Score: 10    End of Session Equipment Utilized During Treatment: Other (comment) (sheet roll as leg lifter) Activity Tolerance: Patient limited by pain Patient left: in bed;with call bell/phone within reach;with bed alarm set Nurse Communication: Mobility status PT Visit Diagnosis: Other abnormalities of gait and mobility (R26.89);History of falling (Z91.81);Pain Pain - Right/Left: Right Pain - part of body: Hip (and groin)    Time: 2423-5361 PT Time Calculation (min) (ACUTE ONLY): 23 min   Charges:   PT Evaluation $PT Eval Moderate Complexity: 1 Mod          Roney Marion, PT  Acute Rehabilitation Services Pager [phone] Office [phone]   Colletta [place] 06/25/2020, 2:08 PM

## 2020-06-25 NOTE — Care Management Obs Status (Signed)
[place] NOTIFICATION   Patient Details  Name: Christopher Wheeler MRN: [id number] Date of Birth: April 03, 1946   Medicare Observation Status Notification Given:  Yes    Norina Buzzard, RN 06/25/2020, 3:38 PM

## 2020-06-25 NOTE — Evaluation (Signed)
Occupational Therapy Evaluation Patient Details Name: Christopher Wheeler MRN: [id number] DOB: [birth year] Today's Date: 06/25/2020    History of Present Illness Christopher Wheeler is a 74 y.o. male with medical history significant of osteoporosis especially affecting the hips, chronic pain and chronic neuromuscular disorder, history of mononeuritis multiplex, hypertension, coronary artery disease, hyperlipidemia who sustained a mechanical fall at home apparently.  He landed on his right hip, sustaining found to have pelvic superior and inferior rami fractures, nondisplaced, nonsurgical fractures.    Clinical Impression   Pt was functioning modified independently with use of AD prior to admission. He presents with significant R LE pain. Evaluation limited to bed level this visit with plan to return after pt is medicated. Pt currently requires set up to total assist for ADL. He will need post acute rehab in SNF prior to return home and is agreeable to this recommendation. Will follow acutely.     Follow Up Recommendations  SNF;Supervision/Assistance - 24 hour    Equipment Recommendations  3 in 1 bedside commode;Wheelchair (measurements OT);Wheelchair cushion (measurements OT)    Recommendations for Other Services       Precautions / Restrictions Precautions Precautions: Fall Restrictions Weight Bearing Restrictions: No      Mobility Bed Mobility               General bed mobility comments: Instructed pt in use of  sheet as leg lifter to position R LE in bed.    Transfers                 General transfer comment: deferred due to pain    Balance                                           ADL either performed or assessed with clinical judgement   ADL Overall ADL's : Needs assistance/impaired Eating/Feeding: Set up;Bed level   Grooming: Set up;Bed level   Upper Body Bathing: Maximal assistance;Bed level   Lower Body Bathing: Bed level;Maximal  assistance   Upper Body Dressing : Minimal assistance;Bed level   Lower Body Dressing: Total assistance;Bed level       Toileting- Clothing Manipulation and Hygiene: Total assistance;Bed level         General ADL Comments: Session limited to bed level due to pain, RN aware.     Vision Baseline Vision/History: Wears glasses Wears Glasses: At all times Patient Visual Report: No change from baseline       Perception     Praxis      Pertinent Vitals/Pain Pain Assessment: Faces Faces Pain Scale: Hurts whole lot Pain Location: R LE Pain Descriptors / Indicators: Grimacing;Guarding Pain Intervention(s): Monitored during session;Repositioned;Patient requesting pain meds-RN notified     Hand Dominance Right   Extremity/Trunk Assessment Upper Extremity Assessment Upper Extremity Assessment: Overall WFL for tasks assessed   Lower Extremity Assessment Lower Extremity Assessment: Defer to PT evaluation       Communication Communication Communication: No difficulties   Cognition Arousal/Alertness: Awake/alert Behavior During Therapy: Anxious Overall Cognitive Status: Within Functional Limits for tasks assessed                                     General Comments       Exercises     Shoulder Instructions  Home Living Family/patient expects to be discharged to:: Private residence Living Arrangements: Alone Available Help at Discharge: Available PRN/intermittently Type of Home: House Home Access: Stairs to enter [employer] of Steps: 2 Entrance Stairs-Rails: None Home Layout: One level     Bathroom Shower/Tub: Occupational psychologist: Handicapped height     Home Equipment: Other (comment);Cane - single point;Bedside commode;Shower seat;Grab bars - tub/shower;Hand held Tourist information centre manager - 4 wheels;Walker - 2 wheels;Wheelchair - manual   Additional Comments: built up shoe for L foot      Prior  Functioning/Environment Level of Independence: Independent with assistive device(s)        Comments: drives, uses 4WW or cane         OT Problem List: Decreased knowledge of use of DME or AE;Pain      OT Treatment/Interventions: Self-care/ADL training;DME and/or AE instruction;Patient/family education;Balance training;Therapeutic activities    OT Goals(Current goals can be found in the care plan section) Acute Rehab OT Goals Patient Stated Goal: to be able to walk to eventually go home OT Goal Formulation: With patient Time For Goal Achievement: 07/09/20 Potential to Achieve Goals: Good  OT Frequency: Min 2X/week   Barriers to D/C: Decreased caregiver support          Co-evaluation PT/OT/SLP Co-Evaluation/Treatment: Yes Reason for Co-Treatment: For patient/therapist safety   OT goals addressed during session: ADL's and self-care      AM-PAC OT "6 Clicks" Daily Activity     Outcome Measure Help from another person eating meals?: None Help from another person taking care of personal grooming?: A Little Help from another person toileting, which includes using toliet, bedpan, or urinal?: Total Help from another person bathing (including washing, rinsing, drying)?: A Lot Help from another person to put on and taking off regular upper body clothing?: A Little Help from another person to put on and taking off regular lower body clothing?: Total 6 Click Score: 14   End of Session Nurse Communication: Patient requests pain meds  Activity Tolerance: Patient limited by pain Patient left: in bed;with call bell/phone within reach;with bed alarm set  OT Visit Diagnosis: Pain Pain - Right/Left: Right Pain - part of body: Leg                Time: 8756-4332 OT Time Calculation (min): 23 min Charges:  OT General Charges $OT Visit: 1 Visit OT Evaluation $OT Eval Moderate Complexity: 1 Mod  Nestor Lewandowsky, OTR/L Acute Rehabilitation Services Pager: [phone] Office:  [phone] Malka So 06/25/2020, 11:09 AM

## 2020-06-25 NOTE — Progress Notes (Signed)
Returned for second visit after pain med administered. Pt requiring +2 max assist for bed mobility and +2 min assist for ambulation with RW short distance, chair following closely. Pt stating he performed better than he anticipated. Left pt with needs met up in chair. Encouraged use of IS.   06/25/20 1200  OT Visit Information  Last OT Received On 06/25/20  Assistance Needed +2  PT/OT/SLP Co-Evaluation/Treatment Yes  Reason for Co-Treatment For patient/therapist safety  OT goals addressed during session Proper use of Adaptive equipment and DME  History of Present Illness Christopher Wheeler is a 74 y.o. male with medical history significant of osteoporosis especially affecting the hips, chronic pain and chronic neuromuscular disorder, history of mononeuritis multiplex, hypertension, coronary artery disease, hyperlipidemia who sustained a mechanical fall at home apparently.  He landed on his right hip, sustaining found to have pelvic superior and inferior rami fractures, nondisplaced, nonsurgical fractures.   Precautions  Precautions Fall  Pain Assessment  Pain Assessment Faces  Faces Pain Scale 6  Pain Location R LE  Pain Descriptors / Indicators Grimacing;Guarding  Pain Intervention(s) Monitored during session;Premedicated before session;Repositioned  Cognition  Arousal/Alertness Awake/alert  Behavior During Therapy Anxious  Overall Cognitive Status Within Functional Limits for tasks assessed  Bed Mobility  Overal bed mobility Needs Assistance  Bed Mobility Rolling;Sidelying to Sit  Rolling +2 for physical assistance;Max assist  Sidelying to sit +2 for physical assistance;Max assist  General bed mobility comments verbal cues for technique, assisted LEs over EOB and to raise trunk  Balance  Overall balance assessment Needs assistance  Sitting balance-Leahy Scale Good  Standing balance-Leahy Scale Poor  Standing balance comment reliant on B UE and external support  Restrictions  Weight  Bearing Restrictions No  Transfers  Overall transfer level Needs assistance  Equipment used Rolling walker (2 wheeled)  Transfers Sit to/from Stand  Sit to Stand Min assist;+2 safety/equipment  General transfer comment verbal cues for technique, increased time  OT - End of Session  Equipment Utilized During Treatment Gait belt;Rolling walker  Activity Tolerance Patient tolerated treatment well  Patient left in chair;with call bell/phone within reach;with chair alarm set  Nurse Communication Mobility status  OT Assessment/Plan  OT Plan Discharge plan remains appropriate  OT Visit Diagnosis Other abnormalities of gait and mobility (R26.89);Unsteadiness on feet (R26.81);Pain  Pain - Right/Left Right  Pain - part of body Leg  OT Frequency (ACUTE ONLY) Min 2X/week  Follow Up Recommendations SNF;Supervision/Assistance - 24 hour  OT Equipment 3 in 1 bedside commode;Wheelchair (measurements OT);Wheelchair cushion (measurements OT)  AM-PAC OT "6 Clicks" Daily Activity Outcome Measure (Version 2)  Help from another person eating meals? 4  Help from another person taking care of personal grooming? 3  Help from another person toileting, which includes using toliet, bedpan, or urinal? 1  Help from another person bathing (including washing, rinsing, drying)? 2  Help from another person to put on and taking off regular upper body clothing? 3  Help from another person to put on and taking off regular lower body clothing? 1  6 Click Score 14  OT Goal Progression  Progress towards OT goals Progressing toward goals  Acute Rehab OT Goals  Patient Stated Goal to be able to walk to eventually go home  OT Goal Formulation With patient  Time For Goal Achievement 07/09/20  Potential to Achieve Goals Good  OT Time Calculation  OT Start Time (ACUTE ONLY) 1140  OT Stop Time (ACUTE ONLY) 1209  OT Time Calculation (  min) 29 min  OT General Charges  $OT Visit 1 Visit  OT Treatments  $Therapeutic Activity  8-22 mins  Nestor Lewandowsky, OTR/L Acute Rehabilitation Services Pager: [phone] Office: [phone]

## 2020-06-25 NOTE — Plan of Care (Signed)

## 2020-06-25 NOTE — Progress Notes (Signed)
PROGRESS NOTE  DAJUAN TURNLEY DJM:[id number] DOB: 1945/10/10   PCP: Ann Held, DO  Patient is from: Home.  Lives alone.  Uses walker occasionally.  DOA: 06/24/2020 LOS: 0  Chief complaints: Fall and right hip pain  Brief Narrative / Interim history: 74 year old male with CAD, HTN, HLD, osteoporosis, chronic pain on opiates, chronic neuromuscular disorder, lumbar radiculopathy, mononeuritis multiplex and unsteady gait brought to ED after accidental fall and right hip pain.  Imaging including x-ray and CT hip revealed acute nondisplaced fracture involving the right inferior and superior pubic rami and advanced degenerative changes of the right femoral acetabular joint.  He was admitted for pain control and placement.  Of note, patient is on multiple pain medications that could increase the risk of fall.  He was seen by [facility] neurosurgery 3 days prior to presentation for lumbar radiculopathy and unsteady gait.  Vital signs and basic labs without significant finding.   Subjective: Seen and examined earlier this morning.  No major events overnight of this morning.  He endorses significant pain in his right hip especially with right leg movement.  Denies chest pain, shortness of breath, GI or UTI symptoms.  Objective: Vitals:   06/24/20 2200 06/24/20 2243 06/25/20 0815 06/25/20 1100  BP: 116/69 126/68 123/74 121/70  Pulse: 68 67 86 85  Resp: 17 18 18    Temp:  98 F (36.7 C) 98 F (36.7 C)   TempSrc:      SpO2: 97% 99% 95%   Weight:      Height:       No intake or output data in the 24 hours ending 06/25/20 1130 Filed Weights   06/24/20 1743  Weight: 88 kg    Examination:  GENERAL: No apparent distress.  Nontoxic. HEENT: MMM.  Vision and hearing grossly intact.  NECK: Supple.  No apparent JVD.  RESP:  No IWOB.  Fair aeration bilaterally. CVS:  RRR. Heart sounds normal.  ABD/GI/GU: BS+. Abd soft, NTND.  MSK/EXT:  Moves extremities but limited in right lower  extremity due to fracture and pain. SKIN: no apparent skin lesion or wound NEURO: Awake, alert and oriented appropriately.  No apparent focal neuro deficit. PSYCH: Calm. Normal affect.  Procedures:  None  Microbiology summarized: [id number] and influenza PCR nonreactive.  Assessment & Plan: Accidental fall at home Nondisplaced right pubic rami fractures Severe arthritis of right hip Lumbar radiculopathy-seen by [facility] orthopedic surgeon/neurosurgery 3 days prior to admission. Osteoporosis-on alendronate. -Pain control with scheduled Tylenol, as needed oxycodone, IV Toradol and IV Dilaudid -Continue home gabapentin and Cymbalta as well -Bowel regimen with MiraLAX and Senokot-S -Continue calcium and vitamin D supplementations -Discussed with orthopedic surgery, Dr. Marcelino Scot who will see patient either later today or tomorrow -PT/OT-NWBT -Check vitamin D level  History of CAD: No cardiopulmonary symptoms. -Continue home metoprolol, Lipitor and aspirin  Essential hypertension: Normotensive. -Metoprolol as above.  Hyperlipidemia: -Continue statin.  Debility/physical deconditioning/unsteady gait: Progressive weakness lately.  Lives alone. -PT/OT eval for placement     Body mass index is 23.61 kg/m.         DVT prophylaxis:  enoxaparin (LOVENOX) injection 40 mg Start: 06/24/20 2230  Code Status: Full code Family Communication: Patient and/or RN. Available if any question.  Status is: Observation  The patient will require care spanning > 2 midnights and should be moved to inpatient because: Ongoing active pain requiring inpatient pain management, Unsafe d/c plan, IV treatments appropriate due to intensity of illness or inability to take  PO and Inpatient level of care appropriate due to severity of illness  Dispo: The patient is from: Home              Anticipated d/c is to: SNF              Anticipated d/c date is: 2 days              Patient currently is not medically stable  to d/c.       Consultants:  Orthopedic surgery   Sch Meds:  Scheduled Meds: . acetaminophen  1,000 mg Oral Q8H  . aspirin EC  81 mg Oral Daily  . atorvastatin  40 mg Oral Daily  . DULoxetine  30 mg Oral Daily  . enoxaparin (LOVENOX) injection  40 mg Subcutaneous Q24H  . gabapentin  600 mg Oral TID  . metoprolol tartrate  12.5 mg Oral BID  . multivitamin with minerals  1 tablet Oral Daily  . oxyCODONE  10 mg Oral TID  . senna-docusate  1 tablet Oral BID  . tiZANidine  4 mg Oral Daily  . Vitamin D (Cholecalciferol)  1,000 Units Oral 5 X Daily   Continuous Infusions: . sodium chloride 50 mL/hr at 06/24/20 2303   PRN Meds:.HYDROmorphone (DILAUDID) injection, ketorolac, ondansetron **OR** ondansetron (ZOFRAN) IV, polyethylene glycol  Antimicrobials: Anti-infectives (From admission, onward)   None       I have personally reviewed the following labs and images: CBC: Recent Labs  Lab 06/24/20 1750 06/24/20 2240 06/25/20 0304  WBC 9.7 11.9* 9.0  NEUTROABS 7.8*  --   --   HGB 14.1 14.3 13.0  HCT 43.2 42.9 39.6  MCV 100.0 97.5 98.5  PLT 168 148* 148*   BMP &GFR Recent Labs  Lab 06/24/20 1750 06/24/20 2240 06/25/20 0304  NA 141  --  139  K 3.8  --  4.0  CL 104  --  104  CO2 28  --  24  GLUCOSE 94  --  141*  BUN 9  --  8  CREATININE 1.07 1.01 1.02  CALCIUM 9.2  --  8.8*   Estimated Creatinine Clearance: 80.4 mL/min (by C-G formula based on SCr of 1.02 mg/dL). Liver & Pancreas: Recent Labs  Lab 06/24/20 1750 06/25/20 0304  AST 36 34  ALT 35 31  ALKPHOS 51 48  BILITOT 0.5 1.0  PROT 6.5 5.7*  ALBUMIN 3.5 3.1*   No results for input(s): LIPASE, AMYLASE in the last 168 hours. No results for input(s): AMMONIA in the last 168 hours. Diabetic: No results for input(s): HGBA1C in the last 72 hours. No results for input(s): GLUCAP in the last 168 hours. Cardiac Enzymes: No results for input(s): CKTOTAL, CKMB, CKMBINDEX, TROPONINI in the last 168  hours. No results for input(s): PROBNP in the last 8760 hours. Coagulation Profile: No results for input(s): INR, PROTIME in the last 168 hours. Thyroid Function Tests: No results for input(s): TSH, T4TOTAL, FREET4, T3FREE, THYROIDAB in the last 72 hours. Lipid Profile: No results for input(s): CHOL, HDL, LDLCALC, TRIG, CHOLHDL, LDLDIRECT in the last 72 hours. Anemia Panel: No results for input(s): VITAMINB12, FOLATE, FERRITIN, TIBC, IRON, RETICCTPCT in the last 72 hours. Urine analysis:    Component Value Date/Time   COLORURINE YELLOW 07/09/2012 [place] 07/09/2012 1427   LABSPEC 1.021 07/09/2012 1427   PHURINE 6.0 07/09/2012 1427   GLUCOSEU NEGATIVE 07/09/2012 1427   HGBUR NEGATIVE 07/09/2012 1427   [place] 07/09/2012 1427  KETONESUR NEGATIVE 07/09/2012 1427   PROTEINUR NEGATIVE 07/09/2012 1427   UROBILINOGEN 0.2 07/09/2012 1427   NITRITE NEGATIVE 07/09/2012 1427   LEUKOCYTESUR NEGATIVE 07/09/2012 1427   Sepsis Labs: Invalid input(s): PROCALCITONIN, [place]  Microbiology: Recent Results (from the past 240 hour(s))  Resp Panel by RT-PCR (Flu A&B, Covid) Nasopharyngeal Swab     Status: None   Collection Time: 06/24/20  5:50 PM   Specimen: Nasopharyngeal Swab; Nasopharyngeal(NP) swabs in vial transport medium  Result Value Ref Range Status   SARS Coronavirus 2 by RT PCR NEGATIVE NEGATIVE Final    Comment: (NOTE) SARS-CoV-2 target nucleic acids are NOT DETECTED.  The SARS-CoV-2 RNA is generally detectable in upper respiratory specimens during the acute phase of infection. The lowest concentration of SARS-CoV-2 viral copies this assay can detect is 138 copies/mL. A negative result does not preclude SARS-Cov-2 infection and should not be used as the sole basis for treatment or other patient management decisions. A negative result may occur with  improper specimen collection/handling, submission of specimen other than nasopharyngeal swab,  presence of viral mutation(s) within the areas targeted by this assay, and inadequate number of viral copies(<138 copies/mL). A negative result must be combined with clinical observations, patient history, and epidemiological information. The expected result is Negative.  Fact Sheet for Patients:  EntrepreneurPulse.com.au  Fact Sheet for Healthcare Providers:  IncredibleEmployment.be  This test is no t yet approved or cleared by the [place] FDA and  has been authorized for detection and/or diagnosis of SARS-CoV-2 by FDA under an Emergency Use Authorization (EUA). This EUA will remain  in effect (meaning this test can be used) for the duration of the COVID-19 declaration under Section 564(b)(1) of the Act, 21 U.S.C.section 360bbb-3(b)(1), unless the authorization is terminated  or revoked sooner.       Influenza A by PCR NEGATIVE NEGATIVE Final   Influenza B by PCR NEGATIVE NEGATIVE Final    Comment: (NOTE) The Xpert Xpress SARS-CoV-2/FLU/RSV plus assay is intended as an aid in the diagnosis of influenza from Nasopharyngeal swab specimens and should not be used as a sole basis for treatment. Nasal washings and aspirates are unacceptable for Xpert Xpress SARS-CoV-2/FLU/RSV testing.  Fact Sheet for Patients: EntrepreneurPulse.com.au  Fact Sheet for Healthcare Providers: IncredibleEmployment.be  This test is not yet approved or cleared by the [place] FDA and has been authorized for detection and/or diagnosis of SARS-CoV-2 by FDA under an Emergency Use Authorization (EUA). This EUA will remain in effect (meaning this test can be used) for the duration of the COVID-19 declaration under Section 564(b)(1) of the Act, 21 U.S.C. section 360bbb-3(b)(1), unless the authorization is terminated or revoked.  Performed at [facility], [place] [address] [place], [place] [address]     Radiology  Studies: DG Chest 2 View  Result Date: 06/24/2020 CLINICAL DATA:  Status post fall. EXAM: CHEST - 2 VIEW COMPARISON:  None. FINDINGS: There is no evidence of acute infiltrate, pleural effusion or pneumothorax. The heart size and mediastinal contours are within normal limits. Degenerative changes seen throughout the thoracic spine. IMPRESSION: No active cardiopulmonary disease. Electronically Signed   By: Virgina Norfolk M.D.   On: 06/24/2020 18:33   CT Hip Right Wo Contrast  Result Date: 06/24/2020 CLINICAL DATA:  Hip trauma. EXAM: CT OF THE RIGHT HIP WITHOUT CONTRAST TECHNIQUE: Multidetector CT imaging of the right hip was performed according to the standard protocol. Multiplanar CT image reconstructions were also generated. COMPARISON:  None. FINDINGS: Bones/Joint/Cartilage There are  advanced degenerative changes of the right femoroacetabular joint. There is no acute displaced fracture or dislocation involving the proximal right femur. There are acute, nondisplaced fractures involving the right inferior and superior pubic rami. Ligaments Suboptimally assessed by CT. Muscles and Tendons There is no acute intramuscular abnormality. Soft tissues The visualized portions of the bladder are unremarkable. A right external iliac artery stent is noted. IMPRESSION: 1. Acute, nondisplaced fractures involving the right inferior and superior pubic rami. 2. No acute displaced fracture or dislocation involving the proximal right femur. 3. Advanced degenerative changes of the right femoroacetabular joint. Electronically Signed   By: Constance Holster M.D.   On: 06/24/2020 20:49   DG Hip Unilat W or Wo Pelvis 2-3 Views Right  Result Date: 06/24/2020 CLINICAL DATA:  Status post fall. EXAM: DG HIP (WITH OR WITHOUT PELVIS) 2-3V RIGHT COMPARISON:  None. FINDINGS: There is no evidence of an acute hip fracture or dislocation. An intact total left hip replacement is seen. Marked severity degenerative changes are seen  involving the right hip, in the form of joint space narrowing and acetabular sclerosis. A radiopaque vascular stent is seen within the abdominal aorta and bilateral common iliac arteries. IMPRESSION: 1.  Marked severity degenerative changes of the right hip. 2. Intact left hip replacement. Electronically Signed   By: Virgina Norfolk M.D.   On: 06/24/2020 18:32     Asmar Brozek T. [place]  If 7PM-7AM, please contact night-coverage [email or web address] 06/25/2020, 11:30 AM

## 2020-06-26 MED ORDER — MAGNESIUM CITRATE PO SOLN
1.0000 | Freq: Every day | ORAL | Status: DC | PRN
  Administered 2020-06-26: 1 via ORAL
  Filled 2020-06-26: qty 296

## 2020-06-26 MED ORDER — SENNOSIDES-DOCUSATE SODIUM 8.6-50 MG PO TABS
2.0000 | ORAL_TABLET | Freq: Two times a day (BID) | ORAL | Status: DC
  Administered 2020-06-26 – 2020-06-27 (×3): 2 via ORAL
  Filled 2020-06-26 (×3): qty 2

## 2020-06-26 NOTE — Consult Note (Addendum)
Orthopaedic Trauma Service (OTS) Consult   Patient ID: Christopher Wheeler MRN: [id number] DOB/AGE: [birth year] 74 y.o.   Reason for Consult: Right pubic rami insufficiency fractures Referring Physician: Wendee Beavers, MD (hospitalist)   HPI: Christopher Wheeler is an 74 y.o. male with a complex medical history including mononeuritis multiplex with profound right-sided weakness, chronic pain disorder and osteoarthritis who sustained a ground-level fall on 06/25/2020.  Patient was in his bathroom when his feet got tripped up.  He initially hit the wall and then slid down landing on his right hip.  Patient had immediate onset of pain and inability to bear weight.  He was brought to [facility] where he was found to have right-sided pubic rami fractures which are consistent with insufficiency type fractures.  Patient does have a fairly complicated neuromuscular history.  Again mononeuritis multiplex in 2002 with significant right-sided disability.  He did require AFO for right sided foot drop however he has been out of this for about 3 years.  He was doing so acupuncture through the [place] which is made significant improvement.  He does have significant muscle atrophy to his right upper and right lower extremities.  He does have walkers and canes of various sorts to facilitate his ambulation however he does not use these around the house.  He grabs onto the wall or handrails to mobilize in the house.  He does have very poor balance which is reduced listening to his history.  He states that he is very active he does like to chop wood with a chainsaw or ask however he does acknowledge that he follows very regularly in the woods and is accustomed to falling on soft ground (patient's words).  He does have a history of being on alendronate for approximately 5 years however after his most recent DEXA scan this year this was discontinued.  He remains on Tums but no other medication for his bone health  Patient  lives alone  History of left total hip arthroplasty.  History of baseline end-stage DJD right hip  Past Medical History:  Diagnosis Date  . Arthritis    osteoarthritis-hips  . Chronic pain disorder 07-09-12   under chronic pain management through [place]   . Neuromuscular disorder ([place]) 07-09-12   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, C4 lesion)-right side muscle wasting    Past Surgical History:  Procedure Laterality Date  . COLONOSCOPY W/ POLYPECTOMY  2016   [facility]  . NEPHRECTOMY Left    partial L nephrectomy   . TOTAL HIP ARTHROPLASTY  07/21/2012   Procedure: TOTAL HIP ARTHROPLASTY ANTERIOR APPROACH;  Surgeon: Mauri Pole, MD;  Location: [facility] ORS;  Service: Orthopedics;  Laterality: Left;    Family History  Problem Relation Age of Onset  . Diabetes Sister   . Diabetes Mellitus II Paternal Grandmother     Social History:  reports that he quit smoking about 19 years ago. His smoking use included cigarettes and pipe. He has never used smokeless tobacco. He reports that he does not drink alcohol and does not use drugs.  Allergies:  Allergies  Allergen Reactions  . Cefazolin Anaphylaxis    Refractory hypotension and rash intraoperatively, cefazolin vs rocuronium  . Cephalosporins Anaphylaxis  . Penicillins Anaphylaxis and Rash  . Rocuronium Anaphylaxis    Refractory hypotension and rash intraoperatively, rocuronium vs cefazolin  . Methadone Other (See Comments)    respiration and blood pressure depressed   .  Levofloxacin Other (See Comments)    Per patient:"Possible Permanent Nerve Damage"  . Morphine And Related Other (See Comments)    respiration and blood pressure depressed   . Other Other (See Comments)    Potato, Hops, Peanut butter: coughing  . Simvastatin Nausea And Vomiting  . Temazepam Other (See Comments)    Excessive sleepiness  . Doxycycline Rash  . Erythromycin Rash    Medications: I have reviewed the  patient's current medications. Current Meds  Medication Sig  . aspirin EC 81 MG tablet Take 81 mg by mouth at bedtime.   Marland Kitchen atorvastatin (LIPITOR) 40 MG tablet Take 40 mg by mouth at bedtime.   . B Complex-C (SUPER B COMPLEX PO) Take 1 tablet by mouth daily with lunch.   . calcium carbonate (TUMS - DOSED IN MG ELEMENTAL CALCIUM) 500 MG chewable tablet Chew 1 tablet by mouth 2 (two) times daily.  . clindamycin (CLEOCIN) 300 MG capsule Take 600 mg by mouth See admin instructions. Take 2 capsules (600mg ) by mouth 1 hour prior to dental appointments.  . diphenhydrAMINE (BENADRYL) 25 mg capsule Take 50 mg by mouth at bedtime as needed for sleep.  Marland Kitchen docusate sodium (COLACE) 100 MG capsule Take 100 mg by mouth 3 (three) times daily.   . DULoxetine (CYMBALTA) 20 MG capsule Take 20 mg by mouth daily.  . fish oil-omega-3 fatty acids 1000 MG capsule Take 2 g by mouth 3 (three) times daily.  Marland Kitchen gabapentin (NEURONTIN) 300 MG capsule Take 600 mg by mouth 3 (three) times daily.  . Garlic Oil 6160 MG CAPS Take 1,000 mg by mouth daily.   Marland Kitchen ketoconazole (NIZORAL) 2 % cream Apply 1 application topically as needed for irritation. Apply to affected area.  Marland Kitchen ketoconazole (NIZORAL) 2 % shampoo Apply 1 application topically 3 (three) times a week.  . Lactobacillus CHEW Chew 2 tablets by mouth 2 (two) times daily.  . Methylsulfonylmethane 1000 MG CAPS Take 1,000 mg by mouth 3 (three) times daily.   . metoprolol tartrate (LOPRESSOR) 25 MG tablet Take 12.5 mg by mouth 2 (two) times daily.   . Multiple Vitamin (MULTIVITAMIN WITH MINERALS) TABS Take 1 tablet by mouth daily.  Marland Kitchen oxyCODONE (OXY IR/ROXICODONE) 5 MG immediate release tablet Take 5 mg by mouth every 4 (four) hours as needed for severe pain.  Marland Kitchen oxyCODONE (OXYCONTIN) 10 mg 12 hr tablet Take 10 mg by mouth 3 (three) times daily. At 0500, 1300, and 2000  . POLYETHYLENE GLYCOL 3350 PO Take 25 g by mouth at bedtime.   . senna (SENOKOT) 8.6 MG tablet Take 4 tablets by  mouth 2 (two) times daily as needed for constipation.   . sildenafil (VIAGRA) 50 MG tablet Take 50 mg by mouth daily as needed for erectile dysfunction.  Marland Kitchen tiZANidine (ZANAFLEX) 4 MG capsule Take 4 mg by mouth daily.   Cristino Martes CAPS Take 2 capsules by mouth at bedtime. 1605 mg capsules   . Vitamin D, Cholecalciferol, 1000 units CAPS Take 1,000 Units by mouth 5 (five) times daily.     Results for orders placed or performed during the hospital encounter of 06/24/20 (from the past 48 hour(s))  Resp Panel by RT-PCR (Flu A&B, Covid) Nasopharyngeal Swab     Status: None   Collection Time: 06/24/20  5:50 PM   Specimen: Nasopharyngeal Swab; Nasopharyngeal(NP) swabs in vial transport medium  Result Value Ref Range   SARS Coronavirus 2 by RT PCR NEGATIVE NEGATIVE    Comment: (NOTE) SARS-CoV-2  target nucleic acids are NOT DETECTED.  The SARS-CoV-2 RNA is generally detectable in upper respiratory specimens during the acute phase of infection. The lowest concentration of SARS-CoV-2 viral copies this assay can detect is 138 copies/mL. A negative result does not preclude SARS-Cov-2 infection and should not be used as the sole basis for treatment or other patient management decisions. A negative result may occur with  improper specimen collection/handling, submission of specimen other than nasopharyngeal swab, presence of viral mutation(s) within the areas targeted by this assay, and inadequate number of viral copies(<138 copies/mL). A negative result must be combined with clinical observations, patient history, and epidemiological information. The expected result is Negative.  Fact Sheet for Patients:  EntrepreneurPulse.com.au  Fact Sheet for Healthcare Providers:  IncredibleEmployment.be  This test is no t yet approved or cleared by the [place] FDA and  has been authorized for detection and/or diagnosis of SARS-CoV-2 by FDA under an Emergency Use  Authorization (EUA). This EUA will remain  in effect (meaning this test can be used) for the duration of the COVID-19 declaration under Section 564(b)(1) of the Act, 21 U.S.C.section 360bbb-3(b)(1), unless the authorization is terminated  or revoked sooner.       Influenza A by PCR NEGATIVE NEGATIVE   Influenza B by PCR NEGATIVE NEGATIVE    Comment: (NOTE) The Xpert Xpress SARS-CoV-2/FLU/RSV plus assay is intended as an aid in the diagnosis of influenza from Nasopharyngeal swab specimens and should not be used as a sole basis for treatment. Nasal washings and aspirates are unacceptable for Xpert Xpress SARS-CoV-2/FLU/RSV testing.  Fact Sheet for Patients: EntrepreneurPulse.com.au  Fact Sheet for Healthcare Providers: IncredibleEmployment.be  This test is not yet approved or cleared by the [place] FDA and has been authorized for detection and/or diagnosis of SARS-CoV-2 by FDA under an Emergency Use Authorization (EUA). This EUA will remain in effect (meaning this test can be used) for the duration of the COVID-19 declaration under Section 564(b)(1) of the Act, 21 U.S.C. section 360bbb-3(b)(1), unless the authorization is terminated or revoked.  Performed at [facility], [place] [address], [place], [place] [address]   CBC with Differential/Platelet     Status: Abnormal   Collection Time: 06/24/20  5:50 PM  Result Value Ref Range   WBC 9.7 4.0 - 10.5 K/uL   RBC 4.32 4.22 - 5.81 MIL/uL   Hemoglobin 14.1 13.0 - 17.0 g/dL   HCT 43.2 39 - 52 %   MCV 100.0 80.0 - 100.0 fL   MCH 32.6 26.0 - 34.0 pg   MCHC 32.6 30.0 - 36.0 g/dL   RDW 13.2 11.5 - 15.5 %   Platelets 168 150 - 400 K/uL   nRBC 0.0 0.0 - 0.2 %   Neutrophils Relative % 80 %   Neutro Abs 7.8 (H) 1.7 - 7.7 K/uL   Lymphocytes Relative 10 %   Lymphs Abs 0.9 0.7 - 4.0 K/uL   Monocytes Relative 7 %   Monocytes Absolute 0.7 0.1 - 1.0 K/uL   Eosinophils Relative 1 %    Eosinophils Absolute 0.1 0.0 - 0.5 K/uL   Basophils Relative 1 %   Basophils Absolute 0.1 0.0 - 0.1 K/uL   Immature Granulocytes 1 %   Abs Immature Granulocytes 0.10 (H) 0.00 - 0.07 K/uL    Comment: Performed at [facility] [address] [place], [place] [address]  Comprehensive metabolic panel     Status: None   Collection Time: 06/24/20  5:50 PM  Result Value Ref Range   Sodium 141 135 - 145 mmol/L   Potassium 3.8 3.5 - 5.1 mmol/L   Chloride 104 98 - 111 mmol/L   CO2 28 22 - 32 mmol/L   Glucose, Bld 94 70 - 99 mg/dL    Comment: Glucose reference range applies only to samples taken after fasting for at least 8 hours.   BUN 9 8 - 23 mg/dL   Creatinine, Ser 1.07 0.61 - 1.24 mg/dL   Calcium 9.2 8.9 - 10.3 mg/dL   Total Protein 6.5 6.5 - 8.1 g/dL   Albumin 3.5 3.5 - 5.0 g/dL   AST 36 15 - 41 U/L   ALT 35 0 - 44 U/L   Alkaline Phosphatase 51 38 - 126 U/L   Total Bilirubin 0.5 0.3 - 1.2 mg/dL   GFR, Estimated >60 >60 mL/min    Comment: (NOTE) Calculated using the CKD-EPI Creatinine Equation (2021)    Anion gap 9 5 - 15    Comment: Performed at [facility] [address], [place], [place] [address]  CBC     Status: Abnormal   Collection Time: 06/24/20 10:40 PM  Result Value Ref Range   WBC 11.9 (H) 4.0 - 10.5 K/uL   RBC 4.40 4.22 - 5.81 MIL/uL   Hemoglobin 14.3 13.0 - 17.0 g/dL   HCT 42.9 39 - 52 %   MCV 97.5 80.0 - 100.0 fL   MCH 32.5 26.0 - 34.0 pg   MCHC 33.3 30.0 - 36.0 g/dL   RDW 13.2 11.5 - 15.5 %   Platelets 148 (L) 150 - 400 K/uL   nRBC 0.0 0.0 - 0.2 %    Comment: Performed at [facility] [address], [place], [place] [address]  Creatinine, serum     Status: None   Collection Time: 06/24/20 10:40 PM  Result Value Ref Range   Creatinine, Ser 1.01 0.61 - 1.24 mg/dL   GFR, Estimated >60 >60 mL/min    Comment: (NOTE) Calculated using the CKD-EPI Creatinine Equation (2021) Performed at [facility] [address] [place],  [place] [address]   Comprehensive metabolic panel     Status: Abnormal   Collection Time: 06/25/20  3:04 AM  Result Value Ref Range   Sodium 139 135 - 145 mmol/L   Potassium 4.0 3.5 - 5.1 mmol/L   Chloride 104 98 - 111 mmol/L   CO2 24 22 - 32 mmol/L   Glucose, Bld 141 (H) 70 - 99 mg/dL    Comment: Glucose reference range applies only to samples taken after fasting for at least 8 hours.   BUN 8 8 - 23 mg/dL   Creatinine, Ser 1.02 0.61 - 1.24 mg/dL   Calcium 8.8 (L) 8.9 - 10.3 mg/dL   Total Protein 5.7 (L) 6.5 - 8.1 g/dL   Albumin 3.1 (L) 3.5 - 5.0 g/dL   AST 34 15 - 41 U/L   ALT 31 0 - 44 U/L   Alkaline Phosphatase 48 38 - 126 U/L   Total Bilirubin 1.0 0.3 - 1.2 mg/dL   GFR, Estimated >60 >60 mL/min    Comment: (NOTE) Calculated using the CKD-EPI Creatinine Equation (2021)    Anion gap 11 5 - 15    Comment: Performed at [facility], [place] [address], [place], [place] [address]  CBC     Status: Abnormal   Collection Time: 06/25/20  3:04 AM  Result Value Ref Range   WBC  9.0 4.0 - 10.5 K/uL   RBC 4.02 (L) 4.22 - 5.81 MIL/uL   Hemoglobin 13.0 13.0 - 17.0 g/dL   HCT 39.6 39 - 52 %   MCV 98.5 80.0 - 100.0 fL   MCH 32.3 26.0 - 34.0 pg   MCHC 32.8 30.0 - 36.0 g/dL   RDW 13.3 11.5 - 15.5 %   Platelets 148 (L) 150 - 400 K/uL   nRBC 0.0 0.0 - 0.2 %    Comment: Performed at [facility] [address] [place], [place] [address]  VITAMIN D 25 Hydroxy (Vit-D Deficiency, Fractures)     Status: None   Collection Time: 06/25/20  1:15 PM  Result Value Ref Range   Vit D, 25-Hydroxy 54.21 30 - 100 ng/mL    Comment: (NOTE) Vitamin D deficiency has been defined by the Institute of Medicine  and an Endocrine Society practice guideline as a level of serum 25-OH  vitamin D less than 20 ng/mL (1,2). The Endocrine Society went on to  further define vitamin D insufficiency as a level between 21 and 29  ng/mL (2).  1. IOM (Institute of Medicine). 2010. Dietary reference intakes for   calcium and D. [place]: The [employer]. 2. Holick MF, Binkley [place], Bischoff-Ferrari HA, et al. Evaluation,  treatment, and prevention of vitamin D deficiency: an Endocrine  Society clinical practice guideline, JCEM. 2011 Jul; 96(7): 1911-30.  Performed at [facility], [place] [address], [place], [place] [address]   TSH     Status: None   Collection Time: 06/25/20  1:15 PM  Result Value Ref Range   TSH 1.182 0.350 - 4.500 uIU/mL    Comment: Performed by a 3rd Generation assay with a functional sensitivity of <=0.01 uIU/mL. Performed at [facility], [place] [address], [place], [place] [address]     DG Chest 2 View  Result Date: 06/24/2020 CLINICAL DATA:  Status post fall. EXAM: CHEST - 2 VIEW COMPARISON:  None. FINDINGS: There is no evidence of acute infiltrate, pleural effusion or pneumothorax. The heart size and mediastinal contours are within normal limits. Degenerative changes seen throughout the thoracic spine. IMPRESSION: No active cardiopulmonary disease. Electronically Signed   By: Virgina Norfolk M.D.   On: 06/24/2020 18:33   DG Pelvis Comp Min 3V  Result Date: 06/25/2020 CLINICAL DATA:  Fall with right-sided pubic fractures EXAM: JUDET PELVIS - 3+ VIEW COMPARISON:  CT 06/24/2020 FINDINGS: Acute nondisplaced fractures of the right superior and inferior pubic rami. No change in alignment from prior CT. Severe osteoarthritis of the right hip. Partially visualized left hip prosthesis. Diffuse osseous demineralization. Aortoiliac stent graft. IMPRESSION: 1. Acute nondisplaced fractures of the right superior and inferior pubic rami. 2. Severe osteoarthritis of the right hip. Electronically Signed   By: Davina Poke D.O.   On: 06/25/2020 13:27   CT Hip Right Wo Contrast  Result Date: 06/24/2020 CLINICAL DATA:  Hip trauma. EXAM: CT OF THE RIGHT HIP WITHOUT CONTRAST TECHNIQUE: Multidetector CT imaging of the right hip was performed according to the  standard protocol. Multiplanar CT image reconstructions were also generated. COMPARISON:  None. FINDINGS: Bones/Joint/Cartilage There are advanced degenerative changes of the right femoroacetabular joint. There is no acute displaced fracture or dislocation involving the proximal right femur. There are acute, nondisplaced fractures involving the right inferior and superior pubic rami. Ligaments Suboptimally assessed by CT. Muscles and Tendons There is no acute intramuscular abnormality. Soft tissues The visualized portions of the  bladder are unremarkable. A right external iliac artery stent is noted. IMPRESSION: 1. Acute, nondisplaced fractures involving the right inferior and superior pubic rami. 2. No acute displaced fracture or dislocation involving the proximal right femur. 3. Advanced degenerative changes of the right femoroacetabular joint. Electronically Signed   By: Constance Holster M.D.   On: 06/24/2020 20:49   DG Hip Unilat W or Wo Pelvis 2-3 Views Right  Result Date: 06/24/2020 CLINICAL DATA:  Status post fall. EXAM: DG HIP (WITH OR WITHOUT PELVIS) 2-3V RIGHT COMPARISON:  None. FINDINGS: There is no evidence of an acute hip fracture or dislocation. An intact total left hip replacement is seen. Marked severity degenerative changes are seen involving the right hip, in the form of joint space narrowing and acetabular sclerosis. A radiopaque vascular stent is seen within the abdominal aorta and bilateral common iliac arteries. IMPRESSION: 1.  Marked severity degenerative changes of the right hip. 2. Intact left hip replacement. Electronically Signed   By: Virgina Norfolk M.D.   On: 06/24/2020 18:32    Intake/Output      11/28 0701 - 11/29 0700 11/29 0701 - 11/30 0700   I.V. (mL/kg) 791.5 (9)    Total Intake(mL/kg) 791.5 (9)    Urine (mL/kg/hr) 650 (0.3)    Total Output 650    Net +141.5            Review of Systems  Constitutional: Negative for chills and fever.  Eyes: Negative for  blurred vision.  Respiratory: Negative for shortness of breath and wheezing.   Cardiovascular: Negative for chest pain and palpitations.  Gastrointestinal: Negative for nausea and vomiting.  Musculoskeletal:       R groin pain   Neurological: Positive for sensory change (chronic, R leg ) and weakness (chronic R leg and R UEx ).   Blood pressure 137/62, pulse (!) 59, temperature 98.5 F (36.9 C), temperature source Oral, resp. rate 18, height 6\' 4"  (1.93 m), weight 88 kg, SpO2 97 %. Physical Exam Vitals and nursing note reviewed.  Constitutional:      General: He is awake. He is not in acute distress.    Appearance: Normal appearance. He is well-developed and well-groomed.  Cardiovascular:     Heart sounds: S1 normal and S2 normal.  Pulmonary:     Effort: Pulmonary effort is normal. No accessory muscle usage.  Abdominal:     Comments: Soft, NTND, + BS  Musculoskeletal:     Comments: Pelvis and bilateral lower extremities + TTP right inguinal region No significant pain with axial loading or logrolling of his right hip No gross instability of his pelvis with lateral compression or AP compression Patient does have pretty significant atrophy of his right leg compared to the left side Sensory functions are diminished throughout chronically to the right side compared to the left EHL and ankle extension are weak compared to the left side however they are grossly intact FHL, ankle flexion and lesser toe motor functions are grossly intact as well Extremities cool but + DP pulses noted No asymmetric swelling No DCT No tenderness to his knee, lower leg, ankle or foot Thigh is nontender No crepitus or gross instability with manipulation of the lower extremities  Motor and sensory function intact of left lower extremity  Skin:    General: Skin is cool.     Capillary Refill: Capillary refill takes less than 2 seconds.  Neurological:     Mental Status: He is alert and oriented to person,  place,  and time.     Comments: Did not assess coordination and gait  Psychiatric:        Attention and Perception: Attention normal.        Mood and Affect: Mood normal.        Behavior: Behavior is cooperative.           Assessment/Plan:  74 year old male ground-level fall with right-sided pelvic insufficiency fractures, baseline neurologic dysfunction right and left lower extremity  -Right-sided pelvic insufficiency fractures, superior and inferior ramus  Weight-bear as tolerated with walker  PT and OT   Recommending SNF however patient adamantly refuses  Unrestricted range of motion right hip  Ice as needed for symptom management.  Patient can try heat as well  No surgical intervention is warranted   Fracture is suggestive of osteoporosis.  Despite would have her his bone density scan indicated this year do think that he would be a candidate for alternative pharmacologic management of his osteoporosis.  He has been on bisphosphonates for 5 years and would not recommend restarting these.  Would suggest Prolia or one of the anabolic agents.   Vitamin D levels look excellent  - Pain management:  Current regimen   [facility]   Do not feel that he needs any escalation in his narcotic medications.   Maximize nonnarcotic analgesia to address this acute pain  - ABL anemia/Hemodynamics  Stable  - Medical issues   Per primary  - DVT/PE prophylaxis:  Mobilize  Do not think pharmacologic prophylaxis is prudent particularly with his admittedly frequent fall rate.   - Metabolic Bone Disease:  Osteoporosis   Follow-up with team actively treating this for further discussions regarding pharmacologic management.  Again recommend Prolia or the anabolic agents  The most recent bone density scan I see in the epic system is from 2016 which shows osteopenia.  Suspect that this is actively being treated at the [place] and I do not have access to the records   -  Activity:  Up with assistance   Recommend using the walker permanently given his gait disturbance and balance issues   Primary goal is to prevent a more severe secondary fracture   - Impediments to fracture healing:  Chronic opiate therapy   Osteoporosis    - Dispo:  Ortho issues stable  Pt states he will not go to snf   Will need [place], PA-C [phone] (C) 06/26/2020, 10:48 AM  [facility] [place] [address] [phone] Jenetta [phone] (F)    After 5pm and on the weekends please log on to Amion, go to orthopaedics and the look under the [facility] Call for the provider(s) on call. You can also call our office at [phone] and then follow the prompts to be connected to the call team.

## 2020-06-26 NOTE — Progress Notes (Signed)
PROGRESS NOTE  Christopher Wheeler DXA:[id number] DOB: 1946-03-26   PCP: Ann Held, DO  Patient is from: Home.  Lives alone.  Uses walker occasionally.  DOA: 06/24/2020 LOS: 1  Chief complaints: Fall and right hip pain  Brief Narrative / Interim history: 74 year old male with CAD, HTN, HLD, osteoporosis, chronic pain on opiates, chronic neuromuscular disorder, lumbar radiculopathy, mononeuritis multiplex and unsteady gait brought to ED after accidental fall and right hip pain.  Imaging including x-ray and CT hip revealed acute nondisplaced fracture involving the right inferior and superior pubic rami and advanced degenerative changes of the right femoral acetabular joint.  He was admitted for pain control and placement.  Of note, patient is on multiple pain medications that could increase the risk of fall.  He was seen by [facility] neurosurgery 3 days prior to presentation for lumbar radiculopathy and unsteady gait.  Vital signs and basic labs without significant finding.  Orthopedic surgery, Dr. Marcelino Scot consulted and recommended WBAT, pain control, therapy and outpatient follow-up.   Subjective: Seen and examined earlier this morning.  No major events overnight of this morning.  Reports 8/10 pain.  Also concerned about constipation.  No other complaints.  Objective: Vitals:   06/25/20 1100 06/25/20 1928 06/26/20 0405 06/26/20 0823  BP: 121/70 110/68 (!) 126/56 137/62  Pulse: 85 (!) 58 61 (!) 59  Resp:  18 18 18   Temp:  98.1 F (36.7 C) 97.8 F (36.6 C) 98.5 F (36.9 C)  TempSrc:  Oral Oral Oral  SpO2:  95% 95% 97%  Weight:      Height:        Intake/Output Summary (Last 24 hours) at 06/26/2020 1104 Last data filed at 06/26/2020 0441 Gross per 24 hour  Intake 791.49 ml  Output 650 ml  Net 141.49 ml   Filed Weights   06/24/20 1743  Weight: 88 kg    Examination:  GENERAL: No apparent distress.  Nontoxic. HEENT: MMM.  Vision and hearing grossly intact.  NECK:  Supple.  No apparent JVD.  RESP: On RA.  No IWOB.  Fair aeration bilaterally. CVS:  RRR. Heart sounds normal.  ABD/GI/GU: BS+. Abd soft, NTND.  MSK/EXT:  Moves extremities. No apparent deformity. No edema.  SKIN: no apparent skin lesion or wound NEURO: Awake, alert and oriented appropriately.  No apparent focal neuro deficit. PSYCH: Calm. Normal affect.  Procedures:  None  Microbiology summarized: MCNOB-09 and influenza PCR nonreactive.  Assessment & Plan: Accidental fall at home Nondisplaced right pubic rami fractures Severe arthritis of right hip Lumbar radiculopathy-seen by [facility] orthopedic surgeon/neurosurgery 3 days prior to admission. Osteoporosis-on alendronate. -Vitamin D level 54 -Pain control with scheduled Tylenol, as needed oxycodone and IV Dilaudid -Continue home gabapentin and Cymbalta as well -Bowel regimen with MiraLAX, Senokot-S and mag citrate based on severity -Discussed with orthopedic surgery, Dr. Marcelino Scot who will see patient either later today or tomorrow -Continue PT/OT-WBAT  History of CAD: No cardiopulmonary symptoms. -Continue home metoprolol, Lipitor and aspirin  Essential hypertension: Normotensive. -Metoprolol as above.  Hyperlipidemia: -Continue statin.  Debility/physical deconditioning/unsteady gait: Progressive weakness lately.  Lives alone. -PT/OT eval for placement     Body mass index is 23.61 kg/m.         DVT prophylaxis:  enoxaparin (LOVENOX) injection 40 mg Start: 06/24/20 2230  Code Status: Full code Family Communication: Patient and/or RN. Available if any question.  Status is: Inpatient  Remains inpatient appropriate because:Ongoing active pain requiring inpatient pain management, Unsafe d/c plan,  IV treatments appropriate due to intensity of illness or inability to take PO and Inpatient level of care appropriate due to severity of illness   Dispo: The patient is from: Home              Anticipated d/c is to: SNF               Anticipated d/c date is: 2 days              Patient currently is medically stable to d/c.             Consultants:  Orthopedic surgery, Dr. Marcelino Scot   Sch Meds:  Scheduled Meds:  acetaminophen  1,000 mg Oral Q8H   aspirin EC  81 mg Oral Daily   atorvastatin  40 mg Oral Daily   cholecalciferol  1,000 Units Oral Daily   DULoxetine  30 mg Oral Daily   enoxaparin (LOVENOX) injection  40 mg Subcutaneous Q24H   gabapentin  600 mg Oral TID   metoprolol tartrate  12.5 mg Oral BID   multivitamin with minerals  1 tablet Oral Daily   oxyCODONE  10 mg Oral TID   senna-docusate  2 tablet Oral BID   tiZANidine  4 mg Oral Daily   Continuous Infusions:  sodium chloride 50 mL/hr at 06/25/20 1819   PRN Meds:.HYDROmorphone (DILAUDID) injection, ketorolac, magnesium citrate, ondansetron **OR** ondansetron (ZOFRAN) IV, polyethylene glycol  Antimicrobials: Anti-infectives (From admission, onward)   None       I have personally reviewed the following labs and images: CBC: Recent Labs  Lab 06/24/20 1750 06/24/20 2240 06/25/20 0304  WBC 9.7 11.9* 9.0  NEUTROABS 7.8*  --   --   HGB 14.1 14.3 13.0  HCT 43.2 42.9 39.6  MCV 100.0 97.5 98.5  PLT 168 148* 148*   BMP &GFR Recent Labs  Lab 06/24/20 1750 06/24/20 2240 06/25/20 0304  NA 141  --  139  K 3.8  --  4.0  CL 104  --  104  CO2 28  --  24  GLUCOSE 94  --  141*  BUN 9  --  8  CREATININE 1.07 1.01 1.02  CALCIUM 9.2  --  8.8*   Estimated Creatinine Clearance: 80.4 mL/min (by C-G formula based on SCr of 1.02 mg/dL). Liver & Pancreas: Recent Labs  Lab 06/24/20 1750 06/25/20 0304  AST 36 34  ALT 35 31  ALKPHOS 51 48  BILITOT 0.5 1.0  PROT 6.5 5.7*  ALBUMIN 3.5 3.1*   No results for input(s): LIPASE, AMYLASE in the last 168 hours. No results for input(s): AMMONIA in the last 168 hours. Diabetic: No results for input(s): HGBA1C in the last 72 hours. No results for input(s): GLUCAP in the last  168 hours. Cardiac Enzymes: No results for input(s): CKTOTAL, CKMB, CKMBINDEX, TROPONINI in the last 168 hours. No results for input(s): PROBNP in the last 8760 hours. Coagulation Profile: No results for input(s): INR, PROTIME in the last 168 hours. Thyroid Function Tests: Recent Labs    06/25/20 1315  TSH 1.182   Lipid Profile: No results for input(s): CHOL, HDL, LDLCALC, TRIG, CHOLHDL, LDLDIRECT in the last 72 hours. Anemia Panel: No results for input(s): VITAMINB12, FOLATE, FERRITIN, TIBC, IRON, RETICCTPCT in the last 72 hours. Urine analysis:    Component Value Date/Time   COLORURINE YELLOW 07/09/2012 1427   APPEARANCEUR CLEAR 07/09/2012 1427   LABSPEC 1.021 07/09/2012 1427   PHURINE 6.0 07/09/2012 1427   GLUCOSEU NEGATIVE  07/09/2012 [place] 07/09/2012 1427   BILIRUBINUR NEGATIVE 07/09/2012 1427   KETONESUR NEGATIVE 07/09/2012 1427   PROTEINUR NEGATIVE 07/09/2012 1427   UROBILINOGEN 0.2 07/09/2012 1427   NITRITE NEGATIVE 07/09/2012 1427   LEUKOCYTESUR NEGATIVE 07/09/2012 1427   Sepsis Labs: Invalid input(s): PROCALCITONIN, [place]  Microbiology: Recent Results (from the past 240 hour(s))  Resp Panel by RT-PCR (Flu A&B, Covid) Nasopharyngeal Swab     Status: None   Collection Time: 06/24/20  5:50 PM   Specimen: Nasopharyngeal Swab; Nasopharyngeal(NP) swabs in vial transport medium  Result Value Ref Range Status   SARS Coronavirus 2 by RT PCR NEGATIVE NEGATIVE Final    Comment: (NOTE) SARS-CoV-2 target nucleic acids are NOT DETECTED.  The SARS-CoV-2 RNA is generally detectable in upper respiratory specimens during the acute phase of infection. The lowest concentration of SARS-CoV-2 viral copies this assay can detect is 138 copies/mL. A negative result does not preclude SARS-Cov-2 infection and should not be used as the sole basis for treatment or other patient management decisions. A negative result may occur with  improper specimen  collection/handling, submission of specimen other than nasopharyngeal swab, presence of viral mutation(s) within the areas targeted by this assay, and inadequate number of viral copies(<138 copies/mL). A negative result must be combined with clinical observations, patient history, and epidemiological information. The expected result is Negative.  Fact Sheet for Patients:  EntrepreneurPulse.com.au  Fact Sheet for Healthcare Providers:  IncredibleEmployment.be  This test is no t yet approved or cleared by the [place] FDA and  has been authorized for detection and/or diagnosis of SARS-CoV-2 by FDA under an Emergency Use Authorization (EUA). This EUA will remain  in effect (meaning this test can be used) for the duration of the COVID-19 declaration under Section 564(b)(1) of the Act, 21 U.S.C.section 360bbb-3(b)(1), unless the authorization is terminated  or revoked sooner.       Influenza A by PCR NEGATIVE NEGATIVE Final   Influenza B by PCR NEGATIVE NEGATIVE Final    Comment: (NOTE) The Xpert Xpress SARS-CoV-2/FLU/RSV plus assay is intended as an aid in the diagnosis of influenza from Nasopharyngeal swab specimens and should not be used as a sole basis for treatment. Nasal washings and aspirates are unacceptable for Xpert Xpress SARS-CoV-2/FLU/RSV testing.  Fact Sheet for Patients: EntrepreneurPulse.com.au  Fact Sheet for Healthcare Providers: IncredibleEmployment.be  This test is not yet approved or cleared by the [place] FDA and has been authorized for detection and/or diagnosis of SARS-CoV-2 by FDA under an Emergency Use Authorization (EUA). This EUA will remain in effect (meaning this test can be used) for the duration of the COVID-19 declaration under Section 564(b)(1) of the Act, 21 U.S.C. section 360bbb-3(b)(1), unless the authorization is terminated or revoked.  Performed at [facility], [place] [address] [place], [place] [address]     Radiology Studies: DG Pelvis Comp Min 3V  Result Date: 06/25/2020 CLINICAL DATA:  Fall with right-sided pubic fractures EXAM: JUDET PELVIS - 3+ VIEW COMPARISON:  CT 06/24/2020 FINDINGS: Acute nondisplaced fractures of the right superior and inferior pubic rami. No change in alignment from prior CT. Severe osteoarthritis of the right hip. Partially visualized left hip prosthesis. Diffuse osseous demineralization. Aortoiliac stent graft. IMPRESSION: 1. Acute nondisplaced fractures of the right superior and inferior pubic rami. 2. Severe osteoarthritis of the right hip. Electronically Signed   By: Davina Poke D.O.   On: 06/25/2020 13:27     Hardin Hardenbrook T. [place]  If 7PM-7AM, please contact night-coverage [email or web address] 06/26/2020, 11:04 AM

## 2020-06-26 NOTE — Progress Notes (Signed)
   06/26/20 1600  Clinical Encounter Type  Visited With Patient  Visit Type Initial  Referral From Nurse  Consult/Referral To Chaplain  Spiritual Encounters  Spiritual Needs Sacred text  Stress Factors  Patient Stress Factors None identified  Family Stress Factors None identified  Patient requested Bible. Chaplain took [place] Bible to patient's room.

## 2020-06-26 NOTE — Plan of Care (Signed)

## 2020-06-26 NOTE — TOC CAGE-AID Note (Signed)
Transition of Care [facility]) - CAGE-AID Screening   Patient Details  Name: Christopher Wheeler MRN: [id number] Date of Birth: 03-15-1946  [facility] CM/SW Contact:    Emeterio Reeve, [place] Phone Number: 06/26/2020, 4:54 PM   Clinical Narrative:  CSW met with pt at bedside. CSW introduced self and explained role at the hospital.  Pt denies alcohol use. Pt denies substance use. Pt did not need any resources at this time.    CAGE-AID Screening:    Have You Ever Felt You Ought to Cut Down on Your Drinking or Drug Use?: No Have People Annoyed You By Critizing Your Drinking Or Drug Use?: No Have You Felt Bad Or Guilty About Your Drinking Or Drug Use?: No Have You Ever Had a Drink or Used Drugs First Thing In The Morning to Steady Your Nerves or to Get Rid of a Hangover?: No CAGE-AID Score: 0  Substance Abuse Education Offered: Yes     Blima Ledger, [place] Social Worker [phone]

## 2020-06-26 NOTE — Progress Notes (Signed)
Physical Therapy Treatment Patient Details Name: Christopher Wheeler MRN: [id number] DOB: 01/22/1946 Today's Date: 06/26/2020    History of Present Illness Christopher Wheeler is a 74 y.o. male with medical history significant of osteoporosis especially affecting the hips, chronic pain and chronic neuromuscular disorder, history of mononeuritis multiplex, hypertension, coronary artery disease, hyperlipidemia who sustained a mechanical fall at home apparently.  He landed on his right hip, sustaining found to have pelvic superior and inferior rami fractures, nondisplaced, nonsurgical fractures.     PT Comments    Patient received in bed, prior to session received pain medication. Patient requires mod assist +1-2 to raise trunk to sitting position. Performed sit to stand with min guard. Ambulated 125 feet with rw and min guard with chair to follow. Slides his right foot forward due to chronic weakness. Patient left in recliner with needs met and ice applied to right hip/low back. Patient will continue to benefit from skilled PT while here to improve bed mobility and functional independence for return home.     Follow Up Recommendations  Home health PT     Equipment Recommendations  Rolling walker with 5" wheels;3in1 (PT)    Recommendations for Other Services       Precautions / Restrictions Precautions Precautions: Fall Restrictions Weight Bearing Restrictions: No    Mobility  Bed Mobility Overal bed mobility: Needs Assistance Bed Mobility: Supine to Sit   Sidelying to sit: Mod assist Supine to sit: Mod assist     General bed mobility comments: Patient used gait belt to assist right LE off bed. Requires mod assist to raise trunk to seated position.  Transfers Overall transfer level: Needs assistance Equipment used: Rolling walker (2 wheeled) Transfers: Sit to/from Stand Sit to Stand: Min guard         General transfer comment: verbal cues for technique, hand placement, and  safety  Ambulation/Gait Ambulation/Gait assistance: Min guard Gait Distance (Feet): 125 Feet Assistive device: Rolling walker (2 wheeled) Gait Pattern/deviations: Step-to pattern;Decreased step length - right Gait velocity: decr   General Gait Details: Cues for sequencing and to use RW to unweigh painful RLE in stance   Stairs             Wheelchair Mobility    Modified Rankin (Stroke Patients Only)       Balance Overall balance assessment: Needs assistance Sitting-balance support: Feet supported Sitting balance-Leahy Scale: Good     Standing balance support: Bilateral upper extremity supported;During functional activity Standing balance-Leahy Scale: Fair Standing balance comment: reliant on RW for pain and stability                            Cognition Arousal/Alertness: Awake/alert Behavior During Therapy: WFL for tasks assessed/performed Overall Cognitive Status: Within Functional Limits for tasks assessed                                        Exercises      General Comments        Pertinent Vitals/Pain Pain Assessment: 0-10 Pain Score: 8  Pain Location: RLE after walking Pain Descriptors / Indicators: Aching;Sore;Discomfort Pain Intervention(s): Limited activity within patient's tolerance;Monitored during session;Repositioned;Ice applied;Premedicated before session    Home Living                      Prior Function  PT Goals (current goals can now be found in the care plan section) Acute Rehab PT Goals Patient Stated Goal: to be able to walk to eventually go home PT Goal Formulation: With patient Time For Goal Achievement: 07/09/20 Potential to Achieve Goals: Good Progress towards PT goals: Progressing toward goals    Frequency    Min 3X/week      PT Plan Discharge plan needs to be updated    Co-evaluation              AM-PAC PT "6 Clicks" Mobility   Outcome Measure  Help  needed turning from your back to your side while in a flat bed without using bedrails?: A Little Help needed moving from lying on your back to sitting on the side of a flat bed without using bedrails?: A Lot Help needed moving to and from a bed to a chair (including a wheelchair)?: A Little Help needed standing up from a chair using your arms (e.g., wheelchair or bedside chair)?: A Little Help needed to walk in hospital room?: A Little Help needed climbing 3-5 steps with a railing? : A Little 6 Click Score: 17    End of Session Equipment Utilized During Treatment: Gait belt Activity Tolerance: Patient limited by pain;Patient limited by fatigue Patient left: in chair;with call bell/phone within reach;with chair alarm set Nurse Communication: Mobility status PT Visit Diagnosis: Difficulty in walking, not elsewhere classified (R26.2);Other abnormalities of gait and mobility (R26.89);Pain Pain - Right/Left: Right Pain - part of body: Hip     Time: 1430-1500 PT Time Calculation (min) (ACUTE ONLY): 30 min  Charges:  $Gait Training: 23-37 mins                     Destanee Bedonie, PT, GCS 06/26/20,3:17 PM

## 2020-06-26 NOTE — NC FL2 (Signed)
[place] LEVEL OF CARE SCREENING TOOL     IDENTIFICATION  Patient Name: Christopher Wheeler Birthdate: Jan 06, 1946 Sex: male Admission Date (Current Location): 06/24/2020  [facility] and [place] Number:  Herbalist and Address:  [facility] [place], [place] [address] [place], [place] [address]      Provider Number: [id number]  Attending Physician Name and Address:  Mercy Riding, MD  Relative Name and Phone Number:       Current Level of Care: Hospital Recommended Level of Care: [place] Prior Approval Number:    Date Approved/Denied:   PASRR Number: [id number]  Discharge Plan:      Current Diagnoses: Patient Active Problem List   Diagnosis Date Noted  . Pelvic fracture ([place]) 06/24/2020  . Essential hypertension 02/15/2020  . Age-related osteoporosis without current pathological fracture 02/15/2020  . Dyslipidemia 02/15/2020  . Osteopenia 02/08/2019  . Chronic pain 11/26/2017  . CAD (coronary artery disease) 11/26/2017  . Atherosclerosis of aorta ([place]) 11/24/2017  . Aneurysm, common iliac artery ([place]) 11/24/2017  . Renal cell carcinoma ([place]) 11/24/2017  . Osteoarthritis, hip, bilateral 11/21/2016  . Expected blood loss anemia 07/22/2012  . Overweight (BMI 25.0-29.9) 07/22/2012  . S/P left THA, AA 07/21/2012  . Neuromuscular disorder ([place]) 07/09/2012    Orientation RESPIRATION BLADDER Height & Weight     Self, Time, Situation, Place  Normal Continent Weight: 194 lb (88 kg) Height:  6\' 4"  (193 cm)  BEHAVIORAL SYMPTOMS/MOOD NEUROLOGICAL BOWEL NUTRITION STATUS      Continent Diet (See discharge summary)  AMBULATORY STATUS COMMUNICATION OF NEEDS Skin   Extensive Assist Verbally Normal                       Personal Care Assistance Level of Assistance  Bathing, Feeding, Dressing Bathing Assistance: Limited assistance Feeding assistance: Independent Dressing Assistance: Limited assistance     Functional  Limitations Info  Sight, Hearing, Speech Sight Info: Adequate Hearing Info: Adequate Speech Info: Adequate    SPECIAL CARE FACTORS FREQUENCY  PT (By licensed PT), OT (By licensed OT)     PT Frequency: 5x a week OT Frequency: 5x a week            Contractures Contractures Info: Not present    Additional Factors Info  Code Status, Allergies Code Status Info: Full Allergies Info: Cefazolin Cephalosporins Penicillins Rocuronium Methadone Levofloxacin Morphine And Related Other Simvastatin Temazepam Doxycycline Erythromycin           Current Medications (06/26/2020):  This is the current hospital active medication list Current Facility-Administered Medications  Medication Dose Route Frequency Provider Last Rate Last Admin  . 0.9 %  sodium chloride infusion   Intravenous Continuous Elwyn Reach, MD 50 mL/hr at 06/25/20 1819 New Bag at 06/25/20 1819  . acetaminophen (TYLENOL) tablet 1,000 mg  1,000 mg Oral Q8H Gonfa, Taye T, MD   1,000 mg at 06/26/20 1427  . aspirin EC tablet 81 mg  81 mg Oral Daily Wendee Beavers T, MD   81 mg at 06/26/20 0903  . atorvastatin (LIPITOR) tablet 40 mg  40 mg Oral Daily Wendee Beavers T, MD   40 mg at 06/26/20 0905  . cholecalciferol (VITAMIN D3) tablet 1,000 Units  1,000 Units Oral Daily Mercy Riding, MD   1,000 Units at 06/26/20 0904  . DULoxetine (CYMBALTA) DR capsule 30 mg  30 mg Oral Daily Gonfa, Charlesetta Ivory, MD   30 mg  at 06/26/20 0904  . enoxaparin (LOVENOX) injection 40 mg  40 mg Subcutaneous Q24H Gala Romney L, MD   40 mg at 06/25/20 2135  . gabapentin (NEURONTIN) capsule 600 mg  600 mg Oral TID Wendee Beavers T, MD   600 mg at 06/26/20 0903  . HYDROmorphone (DILAUDID) injection 0.5 mg  0.5 mg Intravenous Q4H PRN Wendee Beavers T, MD   0.5 mg at 06/26/20 1427  . ketorolac (TORADOL) 15 MG/ML injection 15 mg  15 mg Intravenous Q6H PRN Elwyn Reach, MD   15 mg at 06/25/20 1258  . magnesium citrate solution 1 Bottle  1 Bottle Oral Daily PRN Mercy Riding, MD   1 Bottle at 06/26/20 0911  . metoprolol tartrate (LOPRESSOR) tablet 12.5 mg  12.5 mg Oral BID Wendee Beavers T, MD   12.5 mg at 06/26/20 0905  . multivitamin with minerals tablet 1 tablet  1 tablet Oral Daily Mercy Riding, MD   1 tablet at 06/26/20 0903  . ondansetron (ZOFRAN) tablet 4 mg  4 mg Oral Q6H PRN Elwyn Reach, MD       Or  . ondansetron (ZOFRAN) injection 4 mg  4 mg Intravenous Q6H PRN Elwyn Reach, MD   4 mg at 06/24/20 2226  . oxyCODONE (OXYCONTIN) 12 hr tablet 10 mg  10 mg Oral TID Wendee Beavers T, MD   10 mg at 06/26/20 0905  . polyethylene glycol (MIRALAX / GLYCOLAX) packet 17 g  17 g Oral BID PRN Wendee Beavers T, MD      . senna-docusate (Senokot-S) tablet 2 tablet  2 tablet Oral BID Mercy Riding, MD   2 tablet at 06/26/20 0903  . tiZANidine (ZANAFLEX) tablet 4 mg  4 mg Oral Daily Mercy Riding, MD   4 mg at 06/26/20 3953     Discharge Medications: Please see discharge summary for a list of discharge medications.  Relevant Imaging Results:  Relevant Lab Results:   Additional Information SSN: [id number]  Emeterio Reeve, [place]

## 2020-06-26 NOTE — TOC Initial Note (Signed)
Transition of Care [facility]) - Initial/Assessment Note    Patient Details  Name: Christopher Wheeler MRN: [id number] Date of Birth: 05/03/1946  Transition of Care [facility]) CM/SW Contact:    Emeterio Reeve, [place] Phone Number: 06/26/2020, 3:14 PM  Clinical Narrative:                  CSW met with pt at bedside. CSW introduced self and explained her role at the hospital.  PT reports PTA he was mostly independent and used a walker occassionaly for mobility. Pt was preforming own ADLS.  CSW reviewed pt/ot reccs. Pt is open to SNF to build strength. Pt was no preference and agreed to be faxed out to facilities in the area.   Expected Discharge Plan: Skilled Nursing Facility Barriers to Discharge: Continued Medical Work up   Patient Goals and CMS Choice Patient states their goals for this hospitalization and ongoing recovery are:: build strength, retun home CMS Medicare.gov Compare Post Acute Care list provided to:: Patient Choice offered to / list presented to : Patient  Expected Discharge Plan and Services Expected Discharge Plan: [place]       Living arrangements for the past 2 months: [place]                                      Prior Living Arrangements/Services Living arrangements for the past 2 months: [place] Lives with:: Self Patient language and need for interpreter reviewed:: Yes Do you feel safe going back to the place where you live?: Yes      Need for Family Participation in Patient Care: Yes (Comment) Care giver support system in place?: Yes (comment)   Criminal Activity/Legal Involvement Pertinent to Current Situation/Hospitalization: No - Comment as needed  Activities of Daily Living Home Assistive Devices/Equipment: None ADL Screening (condition at time of admission) Patient's cognitive ability adequate to safely complete daily activities?: Yes Is the patient deaf or have difficulty hearing?: No Does the  patient have difficulty seeing, even when wearing glasses/contacts?: No Does the patient have difficulty concentrating, remembering, or making decisions?: No Patient able to express need for assistance with ADLs?: Yes Does the patient have difficulty dressing or bathing?: No Independently performs ADLs?: Yes (appropriate for developmental age) Does the patient have difficulty walking or climbing stairs?: Yes Weakness of Legs: Right Weakness of Arms/Hands: None  Permission Sought/Granted Permission sought to share information with : Investment banker, corporate granted to share info w AGENCY: SNF        Emotional Assessment Appearance:: Appears stated age Attitude/Demeanor/Rapport: Engaged Affect (typically observed): Appropriate Orientation: : Oriented to Situation, Oriented to  Time, Oriented to Place, Oriented to Self Alcohol / Substance Use: Not Applicable Psych Involvement: No (comment)  Admission diagnosis:  Pelvic fracture ([place]) [S32.9XXA] Closed nondisplaced fracture of pelvis, unspecified part of pelvis, initial encounter ([place]) [S32.9XXA] Patient Active Problem List   Diagnosis Date Noted  . Pelvic fracture ([place]) 06/24/2020  . Essential hypertension 02/15/2020  . Age-related osteoporosis without current pathological fracture 02/15/2020  . Dyslipidemia 02/15/2020  . Osteopenia 02/08/2019  . Chronic pain 11/26/2017  . CAD (coronary artery disease) 11/26/2017  . Atherosclerosis of aorta ([place]) 11/24/2017  . Aneurysm, common iliac artery ([place]) 11/24/2017  . Renal cell carcinoma ([place]) 11/24/2017  . Osteoarthritis, hip, bilateral 11/21/2016  . Expected blood loss anemia 07/22/2012  .  Overweight (BMI 25.0-29.9) 07/22/2012  . S/P left THA, AA 07/21/2012  . Neuromuscular disorder ([place]) 07/09/2012   PCP:  Ann Held, DO Pharmacy:   [id number] [place] [place]Phone: [phone]: [phone]    Social Determinants of Health (SDOH) Interventions    Readmission Risk Interventions No flowsheet data found.  MEmeterio Reeve LLatanya Presser LEagle HarborSocial Worker [phone]

## 2020-06-27 MED ORDER — DOCUSATE SODIUM 100 MG PO CAPS
100.0000 mg | ORAL_CAPSULE | Freq: Three times a day (TID) | ORAL | Status: DC
  Administered 2020-06-27 – 2020-06-28 (×5): 100 mg via ORAL
  Filled 2020-06-27 (×6): qty 1

## 2020-06-27 MED ORDER — SENNA 8.6 MG PO TABS: 4.0000 | ORAL_TABLET | Freq: Two times a day (BID) | ORAL | Status: DC | PRN

## 2020-06-27 MED ORDER — POLYETHYLENE GLYCOL 3350 17 G PO PACK: 17.0000 g | PACK | Freq: Two times a day (BID) | ORAL | Status: DC | PRN

## 2020-06-27 NOTE — Progress Notes (Signed)
Physical Therapy Treatment Patient Details Name: Christopher Wheeler MRN: [id number] DOB: 04-Oct-1945 Today's Date: 06/27/2020    History of Present Illness Christopher Wheeler is a 74 y.o. male with medical history significant of osteoporosis especially affecting the hips, chronic pain and chronic neuromuscular disorder, history of mononeuritis multiplex, hypertension, coronary artery disease, hyperlipidemia who sustained a mechanical fall at home apparently.  He landed on his right hip, sustaining found to have pelvic superior and inferior rami fractures, nondisplaced, nonsurgical fractures.     PT Comments    Patient received in recliner. He is agreeable to PT session. Having increased pain in right hip joint today. He requires increased time with all mobility due to pain. Patient performed sit to stand with min guard. Ambulated 100 feet with RW and slow, labored cadence. One standing rest break. Chair to follow. He required mod assist to return to supine from sitting. Patient will continue to benefit from skilled PT while here to improve functional independence and activity tolerance. He requires little assistance with mobility, but tolerance is low due to pain and would not be safe to return home alone at this time.         Follow Up Recommendations  SNF     Equipment Recommendations  3in1 (PT)    Recommendations for Other Services       Precautions / Restrictions Precautions Precautions: Fall Restrictions Weight Bearing Restrictions: No    Mobility  Bed Mobility Overal bed mobility: Needs Assistance Bed Mobility: Sit to Supine       Sit to supine: Mod assist   General bed mobility comments: requires assist to raise LEs up onto bed.  Transfers Overall transfer level: Needs assistance Equipment used: Rolling walker (2 wheeled) Transfers: Sit to/from Stand Sit to Stand: Min guard         General transfer comment: verbal cues for technique, hand placement, and  safety  Ambulation/Gait Ambulation/Gait assistance: Supervision Gait Distance (Feet): 100 Feet Assistive device: Rolling walker (2 wheeled)   Gait velocity: decr   General Gait Details: cues for upright posture.   Stairs             Wheelchair Mobility    Modified Rankin (Stroke Patients Only)       Balance Overall balance assessment: Needs assistance Sitting-balance support: Feet supported Sitting balance-Leahy Scale: Normal     Standing balance support: Bilateral upper extremity supported;During functional activity Standing balance-Leahy Scale: Fair Standing balance comment: reliant on RW for pain and stability                            Cognition Arousal/Alertness: Awake/alert Behavior During Therapy: WFL for tasks assessed/performed Overall Cognitive Status: Within Functional Limits for tasks assessed                                        Exercises      General Comments        Pertinent Vitals/Pain Pain Assessment: Faces Faces Pain Scale: Hurts whole lot Pain Location: RLE during and after walking Pain Descriptors / Indicators: Aching;Grimacing;Guarding;Discomfort;Sore Pain Intervention(s): Monitored during session;Repositioned;Limited activity within patient's tolerance;Ice applied;Premedicated before session    Home Living                      Prior Function  PT Goals (current goals can now be found in the care plan section) Acute Rehab PT Goals Patient Stated Goal: to be able to walk to eventually go home PT Goal Formulation: With patient Time For Goal Achievement: 07/09/20 Potential to Achieve Goals: Good Progress towards PT goals: Progressing toward goals    Frequency    Min 3X/week      PT Plan Discharge plan needs to be updated    Co-evaluation              AM-PAC PT "6 Clicks" Mobility   Outcome Measure  Help needed turning from your back to your side while in a  flat bed without using bedrails?: A Little Help needed moving from lying on your back to sitting on the side of a flat bed without using bedrails?: A Lot Help needed moving to and from a bed to a chair (including a wheelchair)?: A Little Help needed standing up from a chair using your arms (e.g., wheelchair or bedside chair)?: A Little Help needed to walk in hospital room?: A Little Help needed climbing 3-5 steps with a railing? : A Little 6 Click Score: 17    End of Session Equipment Utilized During Treatment: Gait belt Activity Tolerance: Patient limited by pain;Patient limited by fatigue Patient left: in bed;with bed alarm set;with call bell/phone within reach Nurse Communication: Mobility status PT Visit Diagnosis: Difficulty in walking, not elsewhere classified (R26.2);Other abnormalities of gait and mobility (R26.89);Pain Pain - Right/Left: Right Pain - part of body: Hip     Time: 0925-1002 PT Time Calculation (min) (ACUTE ONLY): 37 min  Charges:  $Gait Training: 23-37 mins                     Akia Desroches, PT, GCS 06/27/20,10:12 AM

## 2020-06-27 NOTE — Progress Notes (Signed)
PROGRESS NOTE  AUBURN HERT OZD:[id number] DOB: 1945/09/16   PCP: Ann Held, DO  Patient is from: Home.  Lives alone.  Uses walker occasionally.  DOA: 06/24/2020 LOS: 2  Chief complaints: Fall and right hip pain  Brief Narrative / Interim history: 74 year old male with CAD, HTN, HLD, osteoporosis, chronic pain on opiates, chronic neuromuscular disorder, lumbar radiculopathy, mononeuritis multiplex and unsteady gait brought to ED after accidental fall and right hip pain.  Imaging including x-ray and CT hip revealed acute nondisplaced fracture involving the right inferior and superior pubic rami and advanced degenerative changes of the right femoral acetabular joint.  He was admitted for pain control and placement.  Of note, patient is on multiple pain medications that could increase the risk of fall.  He was seen by [facility] neurosurgery 3 days prior to presentation for lumbar radiculopathy and unsteady gait.  Vital signs and basic labs without significant finding.  Orthopedic surgery consulted and recommended WBAT, pain control, therapy and outpatient follow-up.  Ortho also suggested bone density scan and Prolia instead of alendronate going forward.  Subjective: Seen and examined earlier this morning.  No major events overnight of this morning.  Has bowel movements after magnesium citrate.  He would like to continue his home bowel regimen.  Reports 8/10 pain in right leg with activity.  Objective: Vitals:   06/26/20 1356 06/26/20 1930 06/27/20 0308 06/27/20 0802  BP: 108/61 132/66 121/67 122/69  Pulse: (!) 58 66 64 78  Resp: 18 17 16 18   Temp: 98 F (36.7 C) 98.3 F (36.8 C) 98.4 F (36.9 C) 98.5 F (36.9 C)  TempSrc:  Oral Oral Oral  SpO2: 96% 98% 93% 95%  Weight:      Height:        Intake/Output Summary (Last 24 hours) at 06/27/2020 1538 Last data filed at 06/27/2020 0900 Gross per 24 hour  Intake 480 ml  Output 1200 ml  Net -720 ml   Filed Weights    06/24/20 1743  Weight: 88 kg    Examination:  GENERAL: No apparent distress.  Nontoxic. HEENT: MMM.  Vision and hearing grossly intact.  NECK: Supple.  No apparent JVD.  RESP: On RA.  No IWOB.  Fair aeration bilaterally. CVS:  RRR. Heart sounds normal.  ABD/GI/GU: BS+. Abd soft, NTND.  MSK/EXT:  Moves extremities.  Some atrophy on the right side.  No edema. SKIN: no apparent skin lesion or wound NEURO: Awake, alert and oriented appropriately.  No apparent focal neuro deficit. PSYCH: Calm. Normal affect.  Procedures:  None  Microbiology summarized: [id number] and influenza PCR nonreactive.  Assessment & Plan: Accidental fall at home Nondisplaced right pubic rami fractures Severe arthritis of right hip Lumbar radiculopathy-seen by [facility] orthopedic surgeon/neurosurgery 3 days prior to admission. Osteoporosis-on alendronate. -Vitamin D level 54-continue vitamin D 1000 units daily.  Takes 5000 units at home. -Pain control with scheduled Tylenol, as needed oxycodone and IV Dilaudid -Continue home gabapentin and Cymbalta as well -Resumed home bowel regimen per patient's request.  Mag citrate for severe constipation -Appreciate Ortho input-WBAT, pain control, therapy, outpatient follow-up, bone density scan and Prolia -Therapy recommended SNF.  History of CAD: No cardiopulmonary symptoms. -Continue home metoprolol, Lipitor and aspirin  Essential hypertension: Normotensive. -Metoprolol as above.  Hyperlipidemia: -Continue statin.  Debility/physical deconditioning/unsteady gait: Progressive weakness lately.  Lives alone. -PT/OT eval for placement -TOC saturating SNF.     Body mass index is 23.61 kg/m.  DVT prophylaxis:  enoxaparin (LOVENOX) injection 40 mg Start: 06/24/20 2230  Code Status: Full code Family Communication: Patient and/or RN. Available if any question.  Status is: Inpatient  Remains inpatient appropriate because:Unsafe d/c plan   Dispo: The  patient is from: Home              Anticipated d/c is to: SNF              Anticipated d/c date is: 1 day              Patient currently is medically stable to d/c.             Consultants:  Orthopedic surgery   Sch Meds:  Scheduled Meds: . acetaminophen  1,000 mg Oral Q8H  . aspirin EC  81 mg Oral Daily  . atorvastatin  40 mg Oral Daily  . cholecalciferol  1,000 Units Oral Daily  . docusate sodium  100 mg Oral TID  . DULoxetine  30 mg Oral Daily  . enoxaparin (LOVENOX) injection  40 mg Subcutaneous Q24H  . gabapentin  600 mg Oral TID  . metoprolol tartrate  12.5 mg Oral BID  . multivitamin with minerals  1 tablet Oral Daily  . oxyCODONE  10 mg Oral TID  . tiZANidine  4 mg Oral Daily   Continuous Infusions: . sodium chloride 50 mL/hr at 06/25/20 1819   PRN Meds:.HYDROmorphone (DILAUDID) injection, magnesium citrate, ondansetron **OR** ondansetron (ZOFRAN) IV, polyethylene glycol, senna  Antimicrobials: Anti-infectives (From admission, onward)   None       I have personally reviewed the following labs and images: CBC: Recent Labs  Lab 06/24/20 1750 06/24/20 2240 06/25/20 0304  WBC 9.7 11.9* 9.0  NEUTROABS 7.8*  --   --   HGB 14.1 14.3 13.0  HCT 43.2 42.9 39.6  MCV 100.0 97.5 98.5  PLT 168 148* 148*   BMP &GFR Recent Labs  Lab 06/24/20 1750 06/24/20 2240 06/25/20 0304  NA 141  --  139  K 3.8  --  4.0  CL 104  --  104  CO2 28  --  24  GLUCOSE 94  --  141*  BUN 9  --  8  CREATININE 1.07 1.01 1.02  CALCIUM 9.2  --  8.8*   Estimated Creatinine Clearance: 80.4 mL/min (by C-G formula based on SCr of 1.02 mg/dL). Liver & Pancreas: Recent Labs  Lab 06/24/20 1750 06/25/20 0304  AST 36 34  ALT 35 31  ALKPHOS 51 48  BILITOT 0.5 1.0  PROT 6.5 5.7*  ALBUMIN 3.5 3.1*   No results for input(s): LIPASE, AMYLASE in the last 168 hours. No results for input(s): AMMONIA in the last 168 hours. Diabetic: No results for input(s): HGBA1C in the last  72 hours. No results for input(s): GLUCAP in the last 168 hours. Cardiac Enzymes: No results for input(s): CKTOTAL, CKMB, CKMBINDEX, TROPONINI in the last 168 hours. No results for input(s): PROBNP in the last 8760 hours. Coagulation Profile: No results for input(s): INR, PROTIME in the last 168 hours. Thyroid Function Tests: Recent Labs    06/25/20 1315  TSH 1.182   Lipid Profile: No results for input(s): CHOL, HDL, LDLCALC, TRIG, CHOLHDL, LDLDIRECT in the last 72 hours. Anemia Panel: No results for input(s): VITAMINB12, FOLATE, FERRITIN, TIBC, IRON, RETICCTPCT in the last 72 hours. Urine analysis:    Component Value Date/Time   COLORURINE YELLOW 07/09/2012 [place] 07/09/2012 1427   LABSPEC 1.021 07/09/2012  [place] 6.0 07/09/2012 1427   GLUCOSEU NEGATIVE 07/09/2012 1427   HGBUR NEGATIVE 07/09/2012 1427   BILIRUBINUR NEGATIVE 07/09/2012 1427   KETONESUR NEGATIVE 07/09/2012 1427   PROTEINUR NEGATIVE 07/09/2012 1427   UROBILINOGEN 0.2 07/09/2012 1427   NITRITE NEGATIVE 07/09/2012 1427   LEUKOCYTESUR NEGATIVE 07/09/2012 1427   Sepsis Labs: Invalid input(s): PROCALCITONIN, [place]  Microbiology: Recent Results (from the past 240 hour(s))  Resp Panel by RT-PCR (Flu A&B, Covid) Nasopharyngeal Swab     Status: None   Collection Time: 06/24/20  5:50 PM   Specimen: Nasopharyngeal Swab; Nasopharyngeal(NP) swabs in vial transport medium  Result Value Ref Range Status   SARS Coronavirus 2 by RT PCR NEGATIVE NEGATIVE Final    Comment: (NOTE) SARS-CoV-2 target nucleic acids are NOT DETECTED.  The SARS-CoV-2 RNA is generally detectable in upper respiratory specimens during the acute phase of infection. The lowest concentration of SARS-CoV-2 viral copies this assay can detect is 138 copies/mL. A negative result does not preclude SARS-Cov-2 infection and should not be used as the sole basis for treatment or other patient management decisions. A negative  result may occur with  improper specimen collection/handling, submission of specimen other than nasopharyngeal swab, presence of viral mutation(s) within the areas targeted by this assay, and inadequate number of viral copies(<138 copies/mL). A negative result must be combined with clinical observations, patient history, and epidemiological information. The expected result is Negative.  Fact Sheet for Patients:  EntrepreneurPulse.com.au  Fact Sheet for Healthcare Providers:  IncredibleEmployment.be  This test is no t yet approved or cleared by the [place] FDA and  has been authorized for detection and/or diagnosis of SARS-CoV-2 by FDA under an Emergency Use Authorization (EUA). This EUA will remain  in effect (meaning this test can be used) for the duration of the COVID-19 declaration under Section 564(b)(1) of the Act, 21 U.S.C.section 360bbb-3(b)(1), unless the authorization is terminated  or revoked sooner.       Influenza A by PCR NEGATIVE NEGATIVE Final   Influenza B by PCR NEGATIVE NEGATIVE Final    Comment: (NOTE) The Xpert Xpress SARS-CoV-2/FLU/RSV plus assay is intended as an aid in the diagnosis of influenza from Nasopharyngeal swab specimens and should not be used as a sole basis for treatment. Nasal washings and aspirates are unacceptable for Xpert Xpress SARS-CoV-2/FLU/RSV testing.  Fact Sheet for Patients: EntrepreneurPulse.com.au  Fact Sheet for Healthcare Providers: IncredibleEmployment.be  This test is not yet approved or cleared by the [place] FDA and has been authorized for detection and/or diagnosis of SARS-CoV-2 by FDA under an Emergency Use Authorization (EUA). This EUA will remain in effect (meaning this test can be used) for the duration of the COVID-19 declaration under Section 564(b)(1) of the Act, 21 U.S.C. section 360bbb-3(b)(1), unless the authorization is  terminated or revoked.  Performed at [facility], [place] [address], [place], [place] [address]     Radiology Studies: No results found.   Santa Abdelrahman T. [place]  If 7PM-7AM, please contact night-coverage [email or web address] 06/27/2020, 3:38 PM

## 2020-06-27 NOTE — TOC Progression Note (Signed)
Transition of Care [facility]) - Progression Note    Patient Details  Name: JUDAH CARCHI MRN: [id number] Date of Birth: 1946/03/13  Transition of Care [facility]) CM/SW [place], [place] Phone Number: 06/27/2020, 4:37 PM  Clinical Narrative:     Pt chooses [place]. CSW called admissions with [facility]. Left message requesting confirmation of bed offer. CSW left contact info of 5N social worker assigned tomorrow.   Expected Discharge Plan: [place] Barriers to Discharge: Continued Medical Work up  Expected Discharge Plan and Services Expected Discharge Plan: [place] arrangements for the past 2 months: [place]                                       Social Determinants of Health (SDOH) Interventions    Readmission Risk Interventions No flowsheet data found.

## 2020-06-27 NOTE — TOC Progression Note (Signed)
Transition of Care [facility]) - Progression Note    Patient Details  Name: Christopher Wheeler MRN: [id number] Date of Birth: 22-Oct-1945  Transition of Care [facility]) CM/SW [place], [place] Phone Number: 06/27/2020, 1:11 PM  Clinical Narrative:     CSW gave pt bed offers. Pt will review and have choice by afternoon.  Expected Discharge Plan: [place] Barriers to Discharge: Continued Medical Work up  Expected Discharge Plan and Services Expected Discharge Plan: [place] arrangements for the past 2 months: [place]                                       Social Determinants of Health (SDOH) Interventions    Readmission Risk Interventions No flowsheet data found.  Emeterio Reeve, Latanya Presser, [place] Social Worker [phone]

## 2020-06-28 DIAGNOSIS — Z96649 Presence of unspecified artificial hip joint: Secondary | ICD-10-CM

## 2020-06-28 LAB — SARS CORONAVIRUS 2 BY RT PCR (HOSPITAL ORDER, PERFORMED IN ~~LOC~~ HOSPITAL LAB): SARS Coronavirus 2: NEGATIVE

## 2020-06-28 MED ORDER — OXYCODONE HCL ER 10 MG PO T12A: 10.0000 mg | EXTENDED_RELEASE_TABLET | Freq: Three times a day (TID) | ORAL | 0 refills | Status: AC

## 2020-06-28 MED ORDER — POLYETHYLENE GLYCOL 3350 17 G PO PACK: 17.0000 g | PACK | Freq: Every day | ORAL | 0 refills | Status: AC

## 2020-06-28 MED ORDER — ACETAMINOPHEN 500 MG PO TABS
500.0000 mg | ORAL_TABLET | Freq: Four times a day (QID) | ORAL | 0 refills | Status: DC | PRN
Start: 2020-06-28 — End: 2022-02-25

## 2020-06-28 MED ORDER — TIZANIDINE HCL 4 MG PO TABS: 4.0000 mg | ORAL_TABLET | Freq: Every day | ORAL | 0 refills | Status: AC | PRN

## 2020-06-28 MED ORDER — OXYCODONE HCL 5 MG PO TABS: 5.0000 mg | ORAL_TABLET | Freq: Four times a day (QID) | ORAL | 0 refills | Status: AC | PRN

## 2020-06-28 NOTE — Progress Notes (Signed)
Occupational Therapy Treatment Patient Details Name: Christopher Wheeler MRN: [id number] DOB: 1945/09/06 Today's Date: 06/28/2020    History of present illness Christopher Wheeler is a 74 y.o. male with medical history significant of osteoporosis especially affecting the hips, chronic pain and chronic neuromuscular disorder, history of mononeuritis multiplex, hypertension, coronary artery disease, hyperlipidemia who sustained a mechanical fall at home apparently.  He landed on his right hip, sustaining found to have pelvic superior and inferior rami fractures, nondisplaced, nonsurgical fractures.    OT comments  Pt progressing towards acute OT goals. Focus of session was functional transfers and mobility (ambulated 5' 2x with seated rest break incorporated. D/c plan remains appropriate.    Follow Up Recommendations  SNF    Equipment Recommendations  3 in 1 bedside commode;Wheelchair (measurements OT);Wheelchair cushion (measurements OT)    Recommendations for Other Services      Precautions / Restrictions Precautions Precautions: Fall Precaution Comments: h/o falls. Restrictions Weight Bearing Restrictions: No       Mobility Bed Mobility Overal bed mobility: Needs Assistance Bed Mobility: Sit to Supine       Sit to supine: Mod assist   General bed mobility comments: assist to advance BLE onto bed  Transfers Overall transfer level: Needs assistance Equipment used: Rolling walker (2 wheeled) Transfers: Sit to/from Stand Sit to Stand: Min guard;Min assist         General transfer comment: light steading assist but overall min guard. extra time and effort    Balance Overall balance assessment: Needs assistance Sitting-balance support: Feet supported Sitting balance-Leahy Scale: Normal     Standing balance support: Bilateral upper extremity supported;During functional activity Standing balance-Leahy Scale: Poor Standing balance comment: reliant on RW for pain and stability                            ADL either performed or assessed with clinical judgement   ADL Overall ADL's : Needs assistance/impaired             Lower Body Bathing: Moderate assistance;Sit to/from stand           Toilet Transfer: Minimal assistance;Stand-pivot;RW;BSC           Functional mobility during ADLs: Min guard;Minimal assistance;Rolling walker (short distances) General ADL Comments: Walked 5' 2x with seated rest break incorporated.      Vision       Perception     Praxis      Cognition Arousal/Alertness: Awake/alert Behavior During Therapy: Anxious Overall Cognitive Status: No family/caregiver present to determine baseline cognitive functioning                                 General Comments: decreased problem solving, internally distracted. STM deficits?, verboise. verbal perseveration?        Exercises     Shoulder Instructions       General Comments      Pertinent Vitals/ Pain       Pain Assessment: Faces Faces Pain Scale: Hurts whole lot Pain Location: RLE during and after walking Pain Descriptors / Indicators: Aching;Grimacing;Guarding;Discomfort;Sore Pain Intervention(s): Premedicated before session;Monitored during session;Limited activity within patient's tolerance;Repositioned  Home Living  Prior Functioning/Environment              Frequency  Min 2X/week        Progress Toward Goals  OT Goals(current goals can now be found in the care plan section)  Progress towards OT goals: Progressing toward goals  Acute Rehab OT Goals Patient Stated Goal: to be able to walk to eventually go home OT Goal Formulation: With patient Time For Goal Achievement: 07/09/20 Potential to Achieve Goals: Good ADL Goals Pt Will Perform Grooming: with set-up;sitting Pt Will Perform Lower Body Bathing: with min assist;with adaptive equipment;sit to/from  stand Pt Will Perform Lower Body Dressing: with min assist;with adaptive equipment;sit to/from stand Pt Will Transfer to Toilet: with min assist;ambulating;bedside commode Pt Will Perform Toileting - Clothing Manipulation and hygiene: with min assist;sit to/from stand Additional ADL Goal #1: Pt will perform bed mobility with min assist using leg lifter as needed to manage R LE.  Plan Discharge plan remains appropriate    Co-evaluation                 AM-PAC OT "6 Clicks" Daily Activity     Outcome Measure   Help from another person eating meals?: None Help from another person taking care of personal grooming?: A Little Help from another person toileting, which includes using toliet, bedpan, or urinal?: Total Help from another person bathing (including washing, rinsing, drying)?: A Lot Help from another person to put on and taking off regular upper body clothing?: A Little Help from another person to put on and taking off regular lower body clothing?: A Lot 6 Click Score: 15    End of Session Equipment Utilized During Treatment: Rolling walker  OT Visit Diagnosis: Other abnormalities of gait and mobility (R26.89);Unsteadiness on feet (R26.81);Pain Pain - Right/Left: Right Pain - part of body: Leg   Activity Tolerance Patient tolerated treatment well   Patient Left in bed;with call bell/phone within reach;with bed alarm set   Nurse Communication          Time: 1029-1100 OT Time Calculation (min): 31 min  Charges: OT General Charges $OT Visit: 1 Visit OT Treatments $Self Care/Home Management : 23-37 mins  Tyrone Schimke, OT Acute Rehabilitation Services Pager: [phone] Office: [phone]   Hortencia Pilar 06/28/2020, 12:51 PM

## 2020-06-28 NOTE — Discharge Summary (Addendum)
Physician Discharge Summary  Christopher [facility] MCN:[id number] DOB: [birth year] DOA: 06/24/2020  PCP: Christopher Held, DO  Admit date: 06/24/2020 Discharge date: 06/28/2020  Admitted From: Home  Disposition:  SNF   Recommendations for Outpatient Follow-up and new medication changes:  1. Follow up with Dr. Carollee Wheeler in 7 days.  2. To consider bone density scan and Prolia.(denosumab), per orthopedics recommendation.  3. Follow up with orthopedics as outpatient.    Home Health: na   Equipment/Devices: na    Discharge Condition: stable  CODE STATUS: full Diet recommendation: heart healthy   Brief/Interim Summary: [facility] was admitted to the [facility] with a working diagnosis of nondisplaced right pubic rami fractures in the setting of severe right hip arthritis.  74 year old male with past medical history of osteoporosis, osteoarthritis, history of mononeuritis multiplex, hypertension, dyslipidemia and coronary artery disease.  Patient lives alone, apparently he sustained a mechanical fall landing on his right hip, triggering a rapid and progressive decline in his physical functional capacity, unable to further take care of himself.  On his initial physical examination his blood pressure was 126/78, heart rate 72, respiratory rate 14, oxygen saturation 96%.  He had dry mucous membranes, his lungs were clear to auscultation bilaterally, heart S1-S2, present rhythmic, soft abdomen, no lower extremity edema, he had tenderness to palpation at the right hip region. Sodium 141, potassium 3.8, chloride 104, bicarb 28, glucose 94, BUN 9, creatinine 1.0, white count 9.7, hemoglobin 14.1, hematocrit 43.2, platelets 168.  SARS COVID-19 negative. CT of the right hip with acute, nondisplaced fractures involving the right inferior and superior pubic rami, no acute displaced fracture or dislocation involving the proximal right femur, advanced degenerative changes of the right femoral acetabular joint.   Pelvis x-rays with acute nondisplaced fractures of the right superior and inferior pubic rami, severe osteoarthritis of the right hip. Chest x-ray negative for infiltrates.  Patient was admitted to the medical ward, he was seen by orthopedics, recommendations to continue weightbearing as tolerated, continue pain control, physical therapy, occupational therapy and DVT prophylaxis.  Patient does follow-up with [facility], for lumbar radiculopathy and ambulatory dysfunction.   1.  Acute nondisplaced fractures involving the right inferior and superior pubic rami, severe osteoarthritis/osteoporosis,/vitamin D deficiency. Patient was admitted to the medical ward, received pain control, DVT prophylaxis, she was evaluated by orthopedics, PT and OT. Her vitamin D, 25-hydroxy was 54.2. Continue vitamin D and calcium supplementation.   Patient was noted to be very weak and deconditioned, plan to transfer to a skilled nursing facility for rehabilitation.  Continue pain control with oxycodone long and short acting.   2.  Coronary artery disease.  Patient remained chest pain-free, continue metoprolol, atorvastatin and aspirin.  3.  Dyslipidemia.  Continue atorvastatin.  4. Depression. Continue with duloxetine.    Discharge Diagnoses:  Principal Problem:   Pelvic fracture ([facility]) Active Problems:   S/P left THA, AA   Osteoarthritis, hip, bilateral   Neuromuscular disorder (HCC)   CAD (coronary artery disease)   Essential hypertension   Age-related osteoporosis without current pathological fracture   Dyslipidemia    Discharge Instructions   Allergies as of 06/28/2020      Reactions   Cefazolin Anaphylaxis   Refractory hypotension and rash intraoperatively, cefazolin vs rocuronium   Cephalosporins Anaphylaxis   Penicillins Anaphylaxis, Rash   Rocuronium Anaphylaxis   Refractory hypotension and rash intraoperatively, rocuronium vs cefazolin   Methadone Other (See Comments)    respiration and blood pressure depressed  Levofloxacin Other (See Comments)   Per patient:"Possible Permanent Nerve Damage"   Morphine And Related Other (See Comments)   respiration and blood pressure depressed    Other Other (See Comments)   Potato, Hops, Peanut butter: coughing   Simvastatin Nausea And Vomiting   Temazepam Other (See Comments)   Excessive sleepiness   Doxycycline Rash   Erythromycin Rash      Medication List    TAKE these medications   acetaminophen 500 MG tablet Commonly known as: TYLENOL Take 1 tablet (500 mg total) by mouth every 6 (six) hours as needed for moderate pain.   aspirin EC 81 MG tablet Take 81 mg by mouth at bedtime.   atorvastatin 40 MG tablet Commonly known as: LIPITOR Take 40 mg by mouth at bedtime.   calcium carbonate 500 MG chewable tablet Commonly known as: TUMS - dosed in mg elemental calcium Chew 1 tablet by mouth 2 (two) times daily.   diphenhydrAMINE 25 mg capsule Commonly known as: BENADRYL Take 50 mg by mouth at bedtime as needed for sleep.   docusate sodium 100 MG capsule Commonly known as: COLACE Take 100 mg by mouth 3 (three) times daily.   DULoxetine 20 MG capsule Commonly known as: CYMBALTA Take 20 mg by mouth daily.   fish oil-omega-3 fatty acids 1000 MG capsule Take 2 g by mouth 3 (three) times daily.   gabapentin 300 MG capsule Commonly known as: NEURONTIN Take 600 mg by mouth 3 (three) times daily.   Garlic Oil 1950 MG Caps Take 1,000 mg by mouth daily.   ketoconazole 2 % cream Commonly known as: NIZORAL Apply 1 application topically as needed for irritation. Apply to affected area.   ketoconazole 2 % shampoo Commonly known as: NIZORAL Apply 1 application topically 3 (three) times a week.   Lactobacillus Chew Chew 2 tablets by mouth 2 (two) times daily.   Methylsulfonylmethane 1000 MG Caps Take 1,000 mg by mouth 3 (three) times daily.   metoprolol tartrate 25 MG tablet Commonly known as:  LOPRESSOR Take 12.5 mg by mouth 2 (two) times daily.   multivitamin with minerals Tabs tablet Take 1 tablet by mouth daily.   oxyCODONE 10 mg 12 hr tablet Commonly known as: OXYCONTIN Take 1 tablet (10 mg total) by mouth 3 (three) times daily. What changed: additional instructions   oxyCODONE 5 MG immediate release tablet Commonly known as: Oxy IR/ROXICODONE Take 1 tablet (5 mg total) by mouth every 6 (six) hours as needed for severe pain. What changed: when to take this   polyethylene glycol 17 g packet Commonly known as: MIRALAX / GLYCOLAX Take 17 g by mouth daily. What changed:   medication strength  how much to take  when to take this   senna 8.6 MG tablet Commonly known as: SENOKOT Take 4 tablets by mouth 2 (two) times daily as needed for constipation.   sildenafil 50 MG tablet Commonly known as: VIAGRA Take 50 mg by mouth daily as needed for erectile dysfunction.   SUPER B COMPLEX PO Take 1 tablet by mouth daily with lunch.   tiZANidine 4 MG capsule Commonly known as: ZANAFLEX Take 4 mg by mouth daily. What changed: Another medication with the same name was added. Make sure you understand how and when to take each.   tiZANidine 4 MG tablet Commonly known as: Zanaflex Take 1 tablet (4 mg total) by mouth daily as needed for muscle spasms. What changed: You were already taking a medication with the same  name, and this prescription was added. Make sure you understand how and when to take each.   Valerian Caps Take 2 capsules by mouth at bedtime. 1605 mg capsules   Vitamin D (Cholecalciferol) 25 MCG (1000 UT) Caps Take 1,000 Units by mouth 5 (five) times daily.       Follow-up Information    [place]e [place], MD. Schedule an appointment as soon as possible for a visit in 3 week(s).   Specialty: Orthopedic Surgery Why: follow up for pelvic fractures Contact information: [place] [address] [phone]              Allergies   Allergen Reactions  . Cefazolin Anaphylaxis    Refractory hypotension and rash intraoperatively, cefazolin vs rocuronium  . Cephalosporins Anaphylaxis  . Penicillins Anaphylaxis and Rash  . Rocuronium Anaphylaxis    Refractory hypotension and rash intraoperatively, rocuronium vs cefazolin  . Methadone Other (See Comments)    respiration and blood pressure depressed   . Levofloxacin Other (See Comments)    Per patient:"Possible Permanent Nerve Damage"  . Morphine And Related Other (See Comments)    respiration and blood pressure depressed   . Other Other (See Comments)    Potato, Hops, Peanut butter: coughing  . Simvastatin Nausea And Vomiting  . Temazepam Other (See Comments)    Excessive sleepiness  . Doxycycline Rash  . Erythromycin Rash    Consultations:  Orthopedics    Procedures/Studies: DG Chest 2 View  Result Date: 06/24/2020 CLINICAL DATA:  Status post fall. EXAM: CHEST - 2 VIEW COMPARISON:  None. FINDINGS: There is no evidence of acute infiltrate, pleural effusion or pneumothorax. The heart size and mediastinal contours are within normal limits. Degenerative changes seen throughout the thoracic spine. IMPRESSION: No active cardiopulmonary disease. Electronically Signed   By: Virgina Norfolk M.D.   On: 06/24/2020 18:33   DG Pelvis Comp Min 3V  Result Date: 06/25/2020 CLINICAL DATA:  Fall with right-sided pubic fractures EXAM: [facility]ET PELVIS - 3+ VIEW COMPARISON:  CT 06/24/2020 FINDINGS: Acute nondisplaced fractures of the right superior and inferior pubic rami. No change in alignment from prior CT. Severe osteoarthritis of the right hip. Partially visualized left hip prosthesis. Diffuse osseous demineralization. Aortoiliac stent graft. IMPRESSION: 1. Acute nondisplaced fractures of the right superior and inferior pubic rami. 2. Severe osteoarthritis of the right hip. Electronically Signed   By: Davina Poke D.O.   On: 06/25/2020 13:27   CT Hip Right Wo  Contrast  Result Date: 06/24/2020 CLINICAL DATA:  Hip trauma. EXAM: CT OF THE RIGHT HIP WITHOUT CONTRAST TECHNIQUE: Multidetector CT imaging of the right hip was performed according to the standard protocol. Multiplanar CT image reconstructions were also generated. COMPARISON:  None. FINDINGS: Bones/Joint/Cartilage There are advanced degenerative changes of the right femoroacetabular joint. There is no acute displaced fracture or dislocation involving the proximal right femur. There are acute, nondisplaced fractures involving the right inferior and superior pubic rami. Ligaments Suboptimally assessed by CT. Muscles and Tendons There is no acute intramuscular abnormality. Soft tissues The visualized portions of the bladder are unremarkable. A right external iliac artery stent is noted. IMPRESSION: 1. Acute, nondisplaced fractures involving the right inferior and superior pubic rami. 2. No acute displaced fracture or dislocation involving the proximal right femur. 3. Advanced degenerative changes of the right femoroacetabular joint. Electronically Signed   By: Constance Holster M.D.   On: 06/24/2020 20:49   DG Hip Unilat W or Wo Pelvis 2-3 Views Right  Result Date: 06/24/2020 CLINICAL DATA:  Status post fall. EXAM: DG HIP (WITH OR WITHOUT PELVIS) 2-3V RIGHT COMPARISON:  None. FINDINGS: There is no evidence of an acute hip fracture or dislocation. An intact total left hip replacement is seen. Marked severity degenerative changes are seen involving the right hip, in the form of joint space narrowing and acetabular sclerosis. A radiopaque vascular stent is seen within the abdominal aorta and bilateral common iliac arteries. IMPRESSION: 1.  Marked severity degenerative changes of the right hip. 2. Intact left hip replacement. Electronically Signed   By: Virgina Norfolk M.D.   On: 06/24/2020 18:32        Subjective: Patient is feeling well has no nausea or vomiting, no chest pain. Has pain at the right  hip and posterior leg improved with analgesics.   Discharge Exam: Vitals:   06/28/20 0300 06/28/20 0756  BP: 126/66 124/69  Pulse: 81 69  Resp: 15 16  Temp: 98.4 F (36.9 C) 98 F (36.7 C)  SpO2: 98% 97%   Vitals:   06/27/20 1356 06/27/20 1950 06/28/20 0300 06/28/20 0756  BP: 128/75 132/66 126/66 124/69  Pulse: 88 84 81 69  Resp: 18 18 15 16   Temp: 98.2 F (36.8 C) 98.7 F (37.1 C) 98.4 F (36.9 C) 98 F (36.7 C)  TempSrc: Oral Oral Oral Oral  SpO2: 96% 98% 98% 97%  Weight:      Height:        General: Not in pain or dyspnea,  Neurology: Awake and alert, non focal  E ENT: no pallor, no icterus, oral mucosa moist Cardiovascular: No JVD. S1-S2 present, rhythmic, no gallops, rubs, or murmurs. No lower extremity edema. Pulmonary: positive breath sounds bilaterally, adequate air movement, no wheezing, rhonchi or rales. Gastrointestinal. Abdomen soft and non tender Skin. No rashes Musculoskeletal: no joint deformities   The results of significant diagnostics from this hospitalization (including imaging, microbiology, ancillary and laboratory) are listed below for reference.     Microbiology: Recent Results (from the past 240 hour(s))  Resp Panel by RT-PCR (Flu A&B, Covid) Nasopharyngeal Swab     Status: None   Collection Time: 06/24/20  5:50 PM   Specimen: Nasopharyngeal Swab; Nasopharyngeal(NP) swabs in vial transport medium  Result Value Ref Range Status   SARS Coronavirus 2 by RT PCR NEGATIVE NEGATIVE Final    Comment: (NOTE) SARS-CoV-2 target nucleic acids are NOT DETECTED.  The SARS-CoV-2 RNA is generally detectable in upper respiratory specimens during the acute phase of infection. The lowest concentration of SARS-CoV-2 viral copies this assay can detect is 138 copies/mL. A negative result does not preclude SARS-Cov-2 infection and should not be used as the sole basis for treatment or other patient management decisions. A negative result may occur with   improper specimen collection/handling, submission of specimen other than nasopharyngeal swab, presence of viral mutation(s) within the areas targeted by this assay, and inadequate number of viral copies(<138 copies/mL). A negative result must be combined with clinical observations, patient history, and epidemiological information. The expected result is Negative.  Fact Sheet for Patients:  EntrepreneurPulse.com.au  Fact Sheet for Healthcare Providers:  IncredibleEmployment.be  This test is no t yet approved or cleared by the [place] FDA and  has been authorized for detection and/or diagnosis of SARS-CoV-2 by FDA under an Emergency Use Authorization (EUA). This EUA will remain  in effect (meaning this test can be used) for the duration of the COVID-19 declaration under Section 564(b)(1) of the Act, 21 U.S.C.section  360bbb-3(b)(1), unless the authorization is terminated  or revoked sooner.       Influenza A by PCR NEGATIVE NEGATIVE Final   Influenza B by PCR NEGATIVE NEGATIVE Final    Comment: (NOTE) The Xpert Xpress SARS-CoV-2/FLU/RSV plus assay is intended as an aid in the diagnosis of influenza from Nasopharyngeal swab specimens and should not be used as a sole basis for treatment. Nasal washings and aspirates are unacceptable for Xpert Xpress SARS-CoV-2/FLU/RSV testing.  Fact Sheet for Patients: EntrepreneurPulse.com.au  Fact Sheet for Healthcare Providers: IncredibleEmployment.be  This test is not yet approved or cleared by the [place] FDA and has been authorized for detection and/or diagnosis of SARS-CoV-2 by FDA under an Emergency Use Authorization (EUA). This EUA will remain in effect (meaning this test can be used) for the duration of the COVID-19 declaration under Section 564(b)(1) of the Act, 21 U.S.C. section 360bbb-3(b)(1), unless the authorization is terminated  or revoked.  Performed at [facility], [place] [address], [place], [place] [address]      Labs: BNP (last 3 results) No results for input(s): BNP in the last 8760 hours. Basic Metabolic Panel: Recent Labs  Lab 06/24/20 1750 06/24/20 2240 06/25/20 0304  NA 141  --  139  K 3.8  --  4.0  CL 104  --  104  CO2 28  --  24  GLUCOSE 94  --  141*  BUN 9  --  8  CREATININE 1.07 1.01 1.02  CALCIUM 9.2  --  8.8*   Liver Function Tests: Recent Labs  Lab 06/24/20 1750 06/25/20 0304  AST 36 34  ALT 35 31  ALKPHOS 51 48  BILITOT 0.5 1.0  PROT 6.5 5.7*  ALBUMIN 3.5 3.1*   No results for input(s): LIPASE, AMYLASE in the last 168 hours. No results for input(s): AMMONIA in the last 168 hours. CBC: Recent Labs  Lab 06/24/20 1750 06/24/20 2240 06/25/20 0304  WBC 9.7 11.9* 9.0  NEUTROABS 7.8*  --   --   HGB 14.1 14.3 13.0  HCT 43.2 42.9 39.6  MCV 100.0 97.5 98.5  PLT 168 148* 148*   Cardiac Enzymes: No results for input(s): CKTOTAL, CKMB, CKMBINDEX, TROPONINI in the last 168 hours. BNP: Invalid input(s): POCBNP CBG: No results for input(s): GLUCAP in the last 168 hours. D-Dimer No results for input(s): DDIMER in the last 72 hours. Hgb A1c No results for input(s): HGBA1C in the last 72 hours. Lipid Profile No results for input(s): CHOL, HDL, LDLCALC, TRIG, CHOLHDL, LDLDIRECT in the last 72 hours. Thyroid function studies Recent Labs    06/25/20 1315  TSH 1.182   Anemia work up No results for input(s): VITAMINB12, FOLATE, FERRITIN, TIBC, IRON, RETICCTPCT in the last 72 hours. Urinalysis    Component Value Date/Time   COLORURINE YELLOW 07/09/2012 1427   APPEARANCEUR CLEAR 07/09/2012 1427   LABSPEC 1.021 07/09/2012 1427   PHURINE 6.0 07/09/2012 1427   GLUCOSEU NEGATIVE 07/09/2012 1427   HGBUR NEGATIVE 07/09/2012 1427   BILIRUBINUR NEGATIVE 07/09/2012 1427   KETONESUR NEGATIVE 07/09/2012 1427   PROTEINUR NEGATIVE 07/09/2012 1427   UROBILINOGEN 0.2  07/09/2012 1427   NITRITE NEGATIVE 07/09/2012 1427   LEUKOCYTESUR NEGATIVE 07/09/2012 1427   Sepsis Labs Invalid input(s): PROCALCITONIN,  WBC,  LACTICIDVEN Microbiology Recent Results (from the past 240 hour(s))  Resp Panel by RT-PCR (Flu A&B, Covid) Nasopharyngeal Swab     Status: None   Collection Time: 06/24/20  5:50 PM   Specimen: Nasopharyngeal Swab;  Nasopharyngeal(NP) swabs in vial transport medium  Result Value Ref Range Status   SARS Coronavirus 2 by RT PCR NEGATIVE NEGATIVE Final    Comment: (NOTE) SARS-CoV-2 target nucleic acids are NOT DETECTED.  The SARS-CoV-2 RNA is generally detectable in upper respiratory specimens during the acute phase of infection. The lowest concentration of SARS-CoV-2 viral copies this assay can detect is 138 copies/mL. A negative result does not preclude SARS-Cov-2 infection and should not be used as the sole basis for treatment or other patient management decisions. A negative result may occur with  improper specimen collection/handling, submission of specimen other than nasopharyngeal swab, presence of viral mutation(s) within the areas targeted by this assay, and inadequate number of viral copies(<138 copies/mL). A negative result must be combined with clinical observations, patient history, and epidemiological information. The expected result is Negative.  Fact Sheet for Patients:  EntrepreneurPulse.com.au  Fact Sheet for Healthcare Providers:  IncredibleEmployment.be  This test is no t yet approved or cleared by the [place] FDA and  has been authorized for detection and/or diagnosis of SARS-CoV-2 by FDA under an Emergency Use Authorization (EUA). This EUA will remain  in effect (meaning this test can be used) for the duration of the COVID-19 declaration under Section 564(b)(1) of the Act, 21 U.S.C.section 360bbb-3(b)(1), unless the authorization is terminated  or revoked sooner.        Influenza A by PCR NEGATIVE NEGATIVE Final   Influenza B by PCR NEGATIVE NEGATIVE Final    Comment: (NOTE) The Xpert Xpress SARS-CoV-2/FLU/RSV plus assay is intended as an aid in the diagnosis of influenza from Nasopharyngeal swab specimens and should not be used as a sole basis for treatment. Nasal washings and aspirates are unacceptable for Xpert Xpress SARS-CoV-2/FLU/RSV testing.  Fact Sheet for Patients: EntrepreneurPulse.com.au  Fact Sheet for Healthcare Providers: IncredibleEmployment.be  This test is not yet approved or cleared by the [place] FDA and has been authorized for detection and/or diagnosis of SARS-CoV-2 by FDA under an Emergency Use Authorization (EUA). This EUA will remain in effect (meaning this test can be used) for the duration of the COVID-19 declaration under Section 564(b)(1) of the Act, 21 U.S.C. section 360bbb-3(b)(1), unless the authorization is terminated or revoked.  Performed at [facility], [place] [address] [place], [place] [address]      Time coordinating discharge: 45 minutes  SIGNED:   Tawni Millers, MD  [facility] 06/28/2020, 12:01 PM

## 2020-06-28 NOTE — Progress Notes (Signed)
Report given to Nurse Kermit Balo at [facility], awaiting [facility] for patient to be transported.

## 2020-06-28 NOTE — TOC Transition Note (Signed)
Transition of Care [facility]) - CM/SW Discharge Note   Patient Details  Name: [facility] MRN: [id number] Date of Birth: 11/06/1945  [facility] of [facility] CM/SW Contact:  Emeterio Reeve, [place] Phone Number: 06/28/2020, 4:02 PM   Clinical Narrative:     Pt will discharge to [facility] via p[facility]. Pts covid test is negative. Pt declined to have CSW call family. [facility] has been called  Nurse to call report to [phone].  Final next level of care: Skilled Nursing Facility Barriers to Discharge: Barriers Resolved   Patient Goals and CMS Choice Patient states their goals for this hospitalization and ongoing recovery are:: build strength, retun home CMS Medicare.gov Compare Post Acute Care list provided to:: Patient Choice offered to / list presented to : Patient  Discharge Placement              Patient chooses bed at: [facility] Patient to be transferred to facility by: [facility] Name of family member notified: Pt declined Patient and family notified of of transfer: 06/28/20  Discharge Plan and Services                                     Social Determinants of Health (SDOH) Interventions     Readmission Risk Interventions No flowsheet data found.  Emeterio Reeve, Latanya Presser, [place] Social Worker [phone]

## 2020-06-28 NOTE — Care Management Important Message (Signed)
Important Message  Patient Details  Name: [facility] MRN: [id number] Date of Birth: February 22, 1946   Medicare Important Message Given:  Yes     Dody Smartt P [place] 06/28/2020, 3:12 PM

## 2020-06-29 DIAGNOSIS — R5381 Other malaise: Secondary | ICD-10-CM | POA: Diagnosis not present

## 2020-06-29 DIAGNOSIS — H933X9 Disorders of unspecified acoustic nerve: Secondary | ICD-10-CM | POA: Diagnosis not present

## 2020-06-29 DIAGNOSIS — M1993 Secondary osteoarthritis, unspecified site: Secondary | ICD-10-CM | POA: Diagnosis not present

## 2020-06-29 DIAGNOSIS — S329XXS Fracture of unspecified parts of lumbosacral spine and pelvis, sequela: Secondary | ICD-10-CM | POA: Diagnosis not present

## 2020-06-29 DIAGNOSIS — S32810S Multiple fractures of pelvis with stable disruption of pelvic ring, sequela: Secondary | ICD-10-CM | POA: Diagnosis not present

## 2020-06-29 DIAGNOSIS — M810 Age-related osteoporosis without current pathological fracture: Secondary | ICD-10-CM | POA: Diagnosis not present

## 2020-06-29 DIAGNOSIS — I2510 Atherosclerotic heart disease of native coronary artery without angina pectoris: Secondary | ICD-10-CM | POA: Diagnosis not present

## 2020-06-29 DIAGNOSIS — M1388 Other specified arthritis, other site: Secondary | ICD-10-CM | POA: Diagnosis not present

## 2020-06-29 DIAGNOSIS — D696 Thrombocytopenia, unspecified: Secondary | ICD-10-CM | POA: Diagnosis not present

## 2020-06-29 DIAGNOSIS — R269 Unspecified abnormalities of gait and mobility: Secondary | ICD-10-CM | POA: Diagnosis not present

## 2020-06-29 DIAGNOSIS — G6289 Other specified polyneuropathies: Secondary | ICD-10-CM | POA: Diagnosis not present

## 2020-06-29 DIAGNOSIS — F39 Unspecified mood [affective] disorder: Secondary | ICD-10-CM | POA: Diagnosis not present

## 2020-06-29 DIAGNOSIS — G629 Polyneuropathy, unspecified: Secondary | ICD-10-CM | POA: Diagnosis not present

## 2020-06-29 DIAGNOSIS — Z7401 Bed confinement status: Secondary | ICD-10-CM | POA: Diagnosis not present

## 2020-06-29 DIAGNOSIS — M8580 Other specified disorders of bone density and structure, unspecified site: Secondary | ICD-10-CM | POA: Diagnosis not present

## 2020-06-29 DIAGNOSIS — S32519D Fracture of superior rim of unspecified pubis, subsequent encounter for fracture with routine healing: Secondary | ICD-10-CM | POA: Diagnosis not present

## 2020-06-29 DIAGNOSIS — G894 Chronic pain syndrome: Secondary | ICD-10-CM | POA: Diagnosis not present

## 2020-06-29 DIAGNOSIS — S32599A Other specified fracture of unspecified pubis, initial encounter for closed fracture: Secondary | ICD-10-CM | POA: Diagnosis not present

## 2020-06-29 DIAGNOSIS — J691 Pneumonitis due to inhalation of oils and essences: Secondary | ICD-10-CM | POA: Diagnosis not present

## 2020-06-29 DIAGNOSIS — E785 Hyperlipidemia, unspecified: Secondary | ICD-10-CM | POA: Diagnosis not present

## 2020-06-29 DIAGNOSIS — M2550 Pain in unspecified joint: Secondary | ICD-10-CM | POA: Diagnosis not present

## 2020-06-29 NOTE — Plan of Care (Signed)

## 2020-06-29 NOTE — Plan of Care (Signed)
[facility] contacted after midnight and stated would accept the patient. Patient alert and oriented x4. On room air. Has pain with movement. Discharge education provided to the patient, with printed AVS.

## 2020-07-03 DIAGNOSIS — F39 Unspecified mood [affective] disorder: Secondary | ICD-10-CM | POA: Diagnosis not present

## 2020-07-03 DIAGNOSIS — S32519D Fracture of superior rim of unspecified pubis, subsequent encounter for fracture with routine healing: Secondary | ICD-10-CM | POA: Diagnosis not present

## 2020-07-03 DIAGNOSIS — D696 Thrombocytopenia, unspecified: Secondary | ICD-10-CM | POA: Diagnosis not present

## 2020-07-03 DIAGNOSIS — R269 Unspecified abnormalities of gait and mobility: Secondary | ICD-10-CM | POA: Diagnosis not present

## 2020-07-03 DIAGNOSIS — I2510 Atherosclerotic heart disease of native coronary artery without angina pectoris: Secondary | ICD-10-CM | POA: Diagnosis not present

## 2020-07-03 DIAGNOSIS — H933X9 Disorders of unspecified acoustic nerve: Secondary | ICD-10-CM | POA: Diagnosis not present

## 2020-07-03 DIAGNOSIS — M810 Age-related osteoporosis without current pathological fracture: Secondary | ICD-10-CM | POA: Diagnosis not present

## 2020-07-03 DIAGNOSIS — S32599A Other specified fracture of unspecified pubis, initial encounter for closed fracture: Secondary | ICD-10-CM | POA: Diagnosis not present

## 2020-07-03 DIAGNOSIS — R5381 Other malaise: Secondary | ICD-10-CM | POA: Diagnosis not present

## 2020-07-03 DIAGNOSIS — M1388 Other specified arthritis, other site: Secondary | ICD-10-CM | POA: Diagnosis not present

## 2020-07-03 DIAGNOSIS — G6289 Other specified polyneuropathies: Secondary | ICD-10-CM | POA: Diagnosis not present

## 2020-07-03 DIAGNOSIS — J691 Pneumonitis due to inhalation of oils and essences: Secondary | ICD-10-CM | POA: Diagnosis not present

## 2020-07-10 DIAGNOSIS — S32519D Fracture of superior rim of unspecified pubis, subsequent encounter for fracture with routine healing: Secondary | ICD-10-CM | POA: Diagnosis not present

## 2020-07-10 DIAGNOSIS — R5381 Other malaise: Secondary | ICD-10-CM | POA: Diagnosis not present

## 2020-07-10 DIAGNOSIS — I2510 Atherosclerotic heart disease of native coronary artery without angina pectoris: Secondary | ICD-10-CM | POA: Diagnosis not present

## 2020-07-10 DIAGNOSIS — G629 Polyneuropathy, unspecified: Secondary | ICD-10-CM | POA: Diagnosis not present

## 2020-07-11 ENCOUNTER — Telehealth: Payer: Self-pay | Admitting: Family Medicine

## 2020-07-11 NOTE — Telephone Encounter (Signed)
Verbal given 

## 2020-07-11 NOTE — Telephone Encounter (Signed)
Caller/Agency: [facility] ([facility]) Callback Number: [phone] Requesting OT/PT/Skilled Nursing/Social Work/Speech Therapy: Nursing / PT/ OT Frequency: Initial Evaluation  Patient was refer to [facility] by [facility]. [facility] would like to know if you would give them verbal order.

## 2020-07-20 ENCOUNTER — Inpatient Hospital Stay: Payer: Medicare Other | Admitting: Family Medicine

## 2020-08-03 ENCOUNTER — Inpatient Hospital Stay: Payer: Medicare Other | Admitting: Family Medicine

## 2020-09-14 DIAGNOSIS — H5213 Myopia, bilateral: Secondary | ICD-10-CM | POA: Diagnosis not present

## 2020-09-14 DIAGNOSIS — H25819 Combined forms of age-related cataract, unspecified eye: Secondary | ICD-10-CM | POA: Diagnosis not present

## 2020-09-14 DIAGNOSIS — H524 Presbyopia: Secondary | ICD-10-CM | POA: Diagnosis not present

## 2020-09-14 DIAGNOSIS — H25813 Combined forms of age-related cataract, bilateral: Secondary | ICD-10-CM | POA: Diagnosis not present

## 2020-09-14 DIAGNOSIS — H52223 Regular astigmatism, bilateral: Secondary | ICD-10-CM | POA: Diagnosis not present

## 2020-10-19 DIAGNOSIS — H2513 Age-related nuclear cataract, bilateral: Secondary | ICD-10-CM | POA: Diagnosis not present

## 2020-10-19 DIAGNOSIS — H2511 Age-related nuclear cataract, right eye: Secondary | ICD-10-CM | POA: Diagnosis not present

## 2020-10-19 DIAGNOSIS — H18413 Arcus senilis, bilateral: Secondary | ICD-10-CM | POA: Diagnosis not present

## 2020-10-19 DIAGNOSIS — H25043 Posterior subcapsular polar age-related cataract, bilateral: Secondary | ICD-10-CM | POA: Diagnosis not present

## 2020-10-19 DIAGNOSIS — H25013 Cortical age-related cataract, bilateral: Secondary | ICD-10-CM | POA: Diagnosis not present

## 2021-01-03 DIAGNOSIS — Z9841 Cataract extraction status, right eye: Secondary | ICD-10-CM | POA: Diagnosis not present

## 2021-01-03 DIAGNOSIS — H52221 Regular astigmatism, right eye: Secondary | ICD-10-CM | POA: Diagnosis not present

## 2021-01-03 DIAGNOSIS — H2511 Age-related nuclear cataract, right eye: Secondary | ICD-10-CM | POA: Diagnosis not present

## 2021-01-03 DIAGNOSIS — Z961 Presence of intraocular lens: Secondary | ICD-10-CM | POA: Diagnosis not present

## 2021-01-03 DIAGNOSIS — H5201 Hypermetropia, right eye: Secondary | ICD-10-CM | POA: Diagnosis not present

## 2021-01-04 DIAGNOSIS — H2512 Age-related nuclear cataract, left eye: Secondary | ICD-10-CM | POA: Diagnosis not present

## 2021-01-17 DIAGNOSIS — H2512 Age-related nuclear cataract, left eye: Secondary | ICD-10-CM | POA: Diagnosis not present

## 2021-01-17 DIAGNOSIS — H5203 Hypermetropia, bilateral: Secondary | ICD-10-CM | POA: Diagnosis not present

## 2021-01-17 DIAGNOSIS — Z961 Presence of intraocular lens: Secondary | ICD-10-CM | POA: Diagnosis not present

## 2021-01-17 DIAGNOSIS — Z9842 Cataract extraction status, left eye: Secondary | ICD-10-CM | POA: Diagnosis not present

## 2021-01-17 DIAGNOSIS — H52223 Regular astigmatism, bilateral: Secondary | ICD-10-CM | POA: Diagnosis not present

## 2021-02-01 IMAGING — CR DG HIP (WITH OR WITHOUT PELVIS) 2-3V*R*
3 series · 3 of 3 positions shown · non-contrast
Comparison: None.

CLINICAL DATA: Status post fall.

EXAM:
DG HIP (WITH OR WITHOUT PELVIS) 2-3V RIGHT

[pelvis ap]
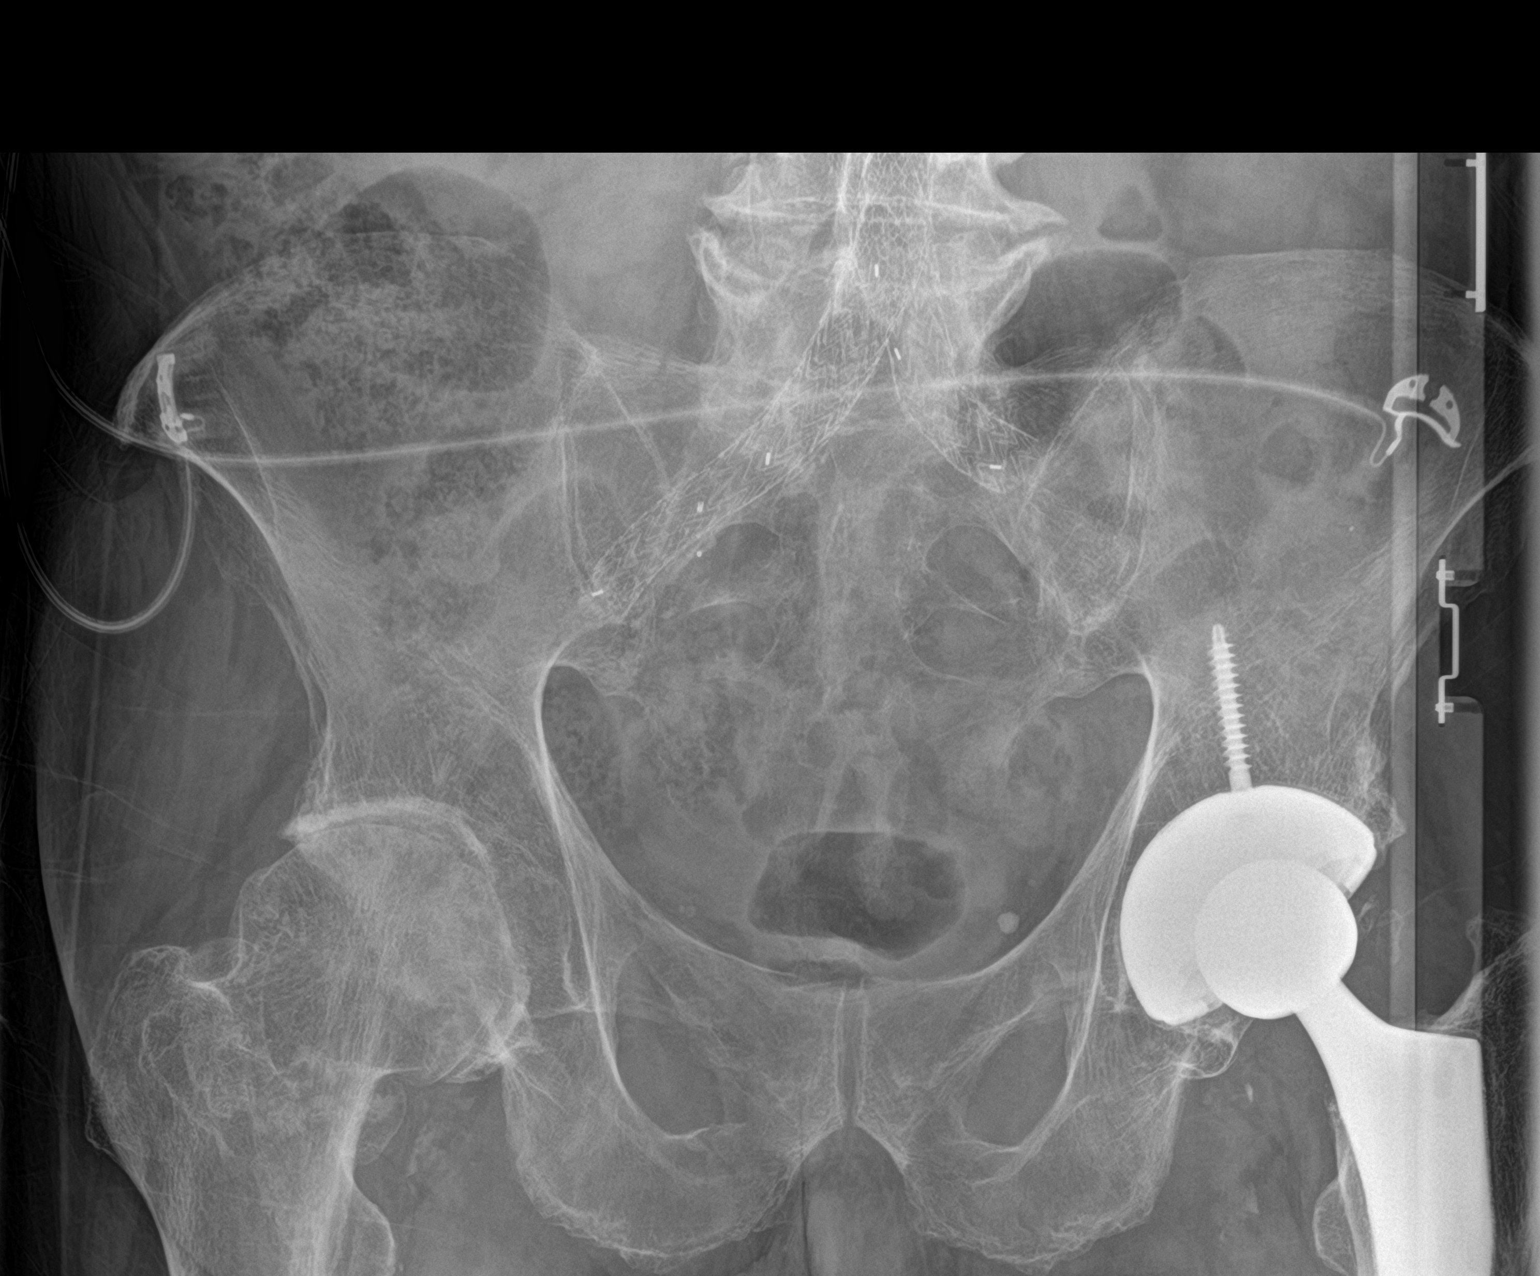

[hip ap]
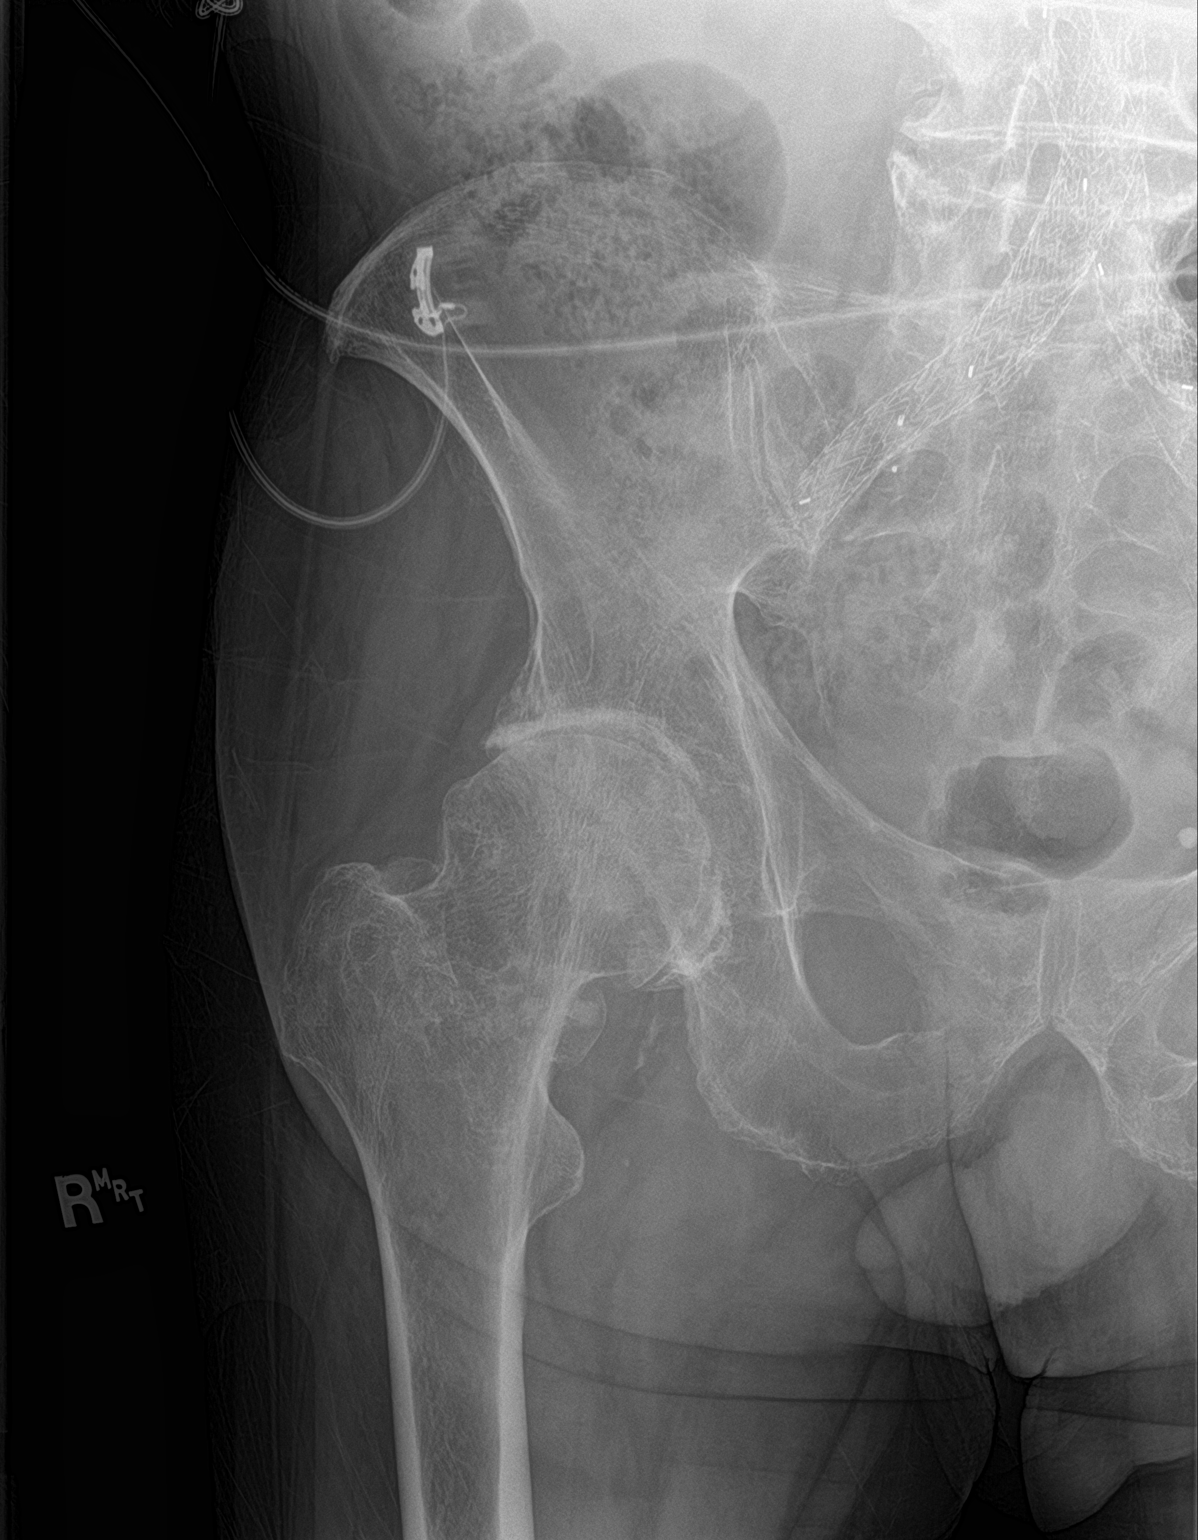

[hip lat]
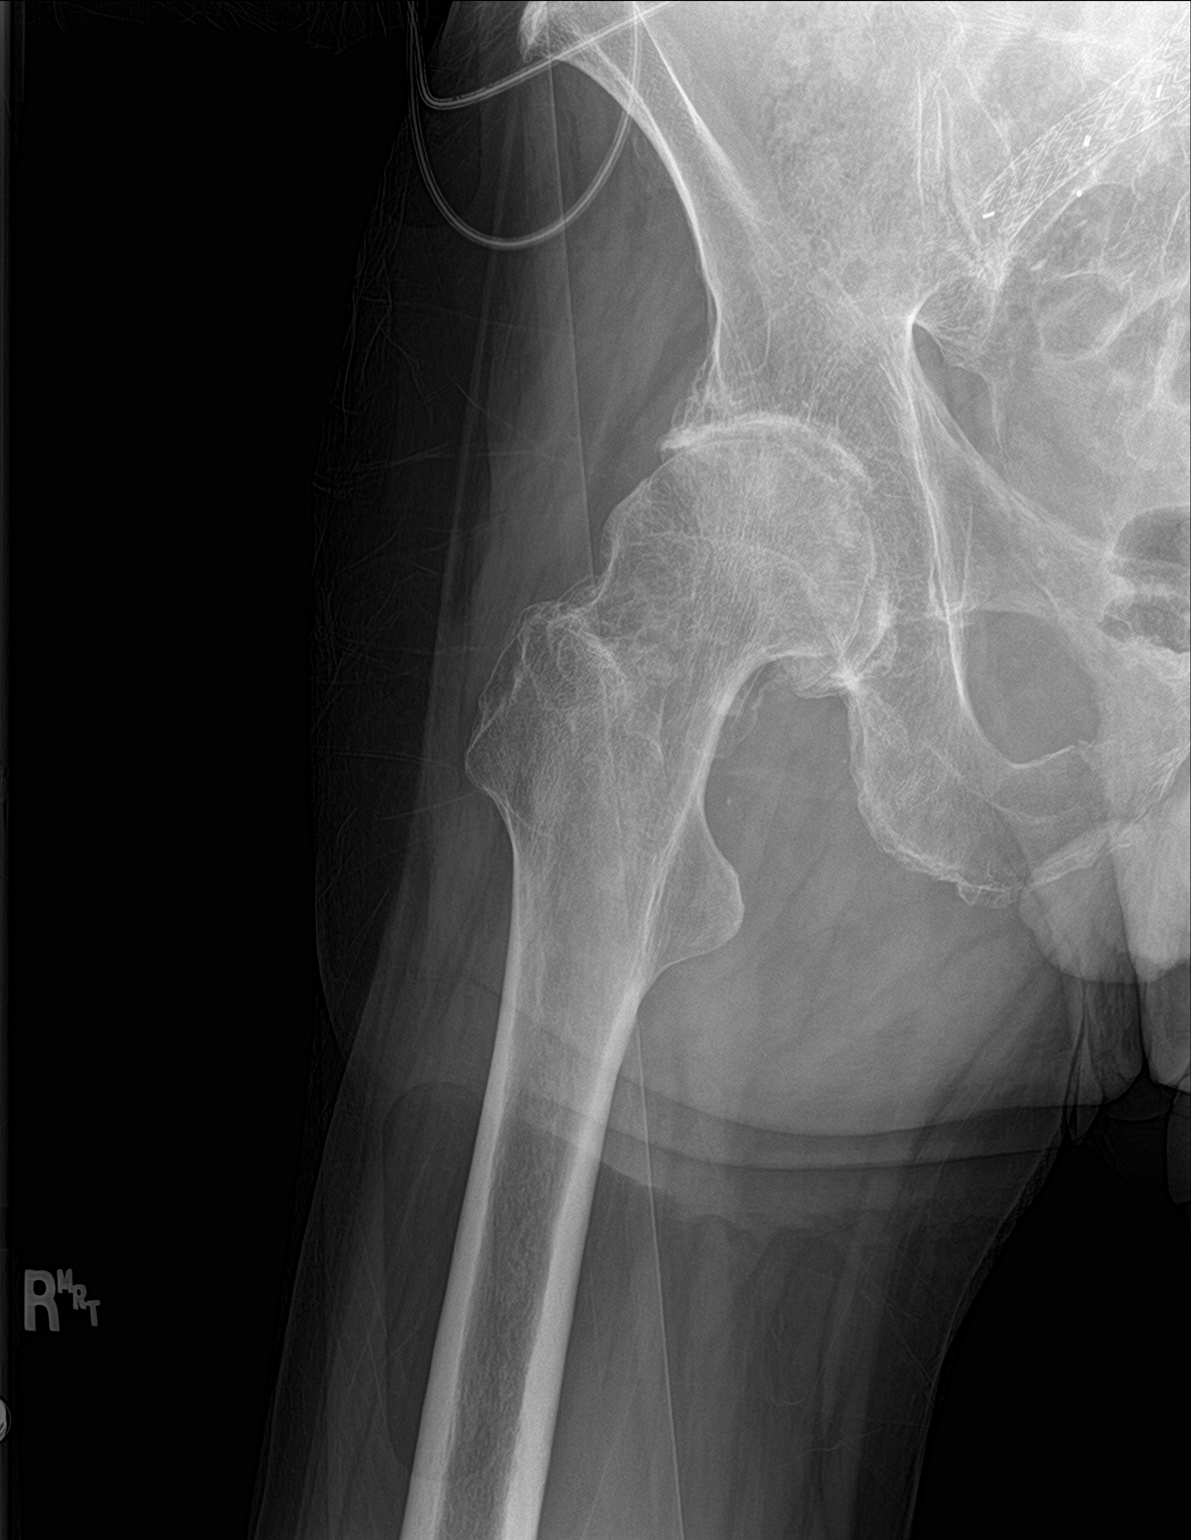

[3 of 3 positions shown; findings below may reference images not displayed]

FINDINGS: There is no evidence of an acute hip fracture or dislocation. An
intact total left hip replacement is seen. Marked severity
degenerative changes are seen involving the right hip, in the form
of joint space narrowing and acetabular sclerosis. A radiopaque
vascular stent is seen within the abdominal aorta and bilateral
common iliac arteries.
IMPRESSION: 1.  Marked severity degenerative changes of the right hip.
2. Intact left hip replacement.

## 2021-02-19 ENCOUNTER — Ambulatory Visit (INDEPENDENT_AMBULATORY_CARE_PROVIDER_SITE_OTHER): Payer: Medicare Other | Admitting: Family Medicine

## 2021-02-19 ENCOUNTER — Other Ambulatory Visit: Payer: Self-pay

## 2021-02-19 ENCOUNTER — Encounter: Payer: Self-pay | Admitting: Family Medicine

## 2021-02-19 ENCOUNTER — Ambulatory Visit: Payer: Medicare Other | Admitting: *Deleted

## 2021-02-19 VITALS — BP 130/80 | HR 59 | Temp 98.0°F | Resp 20 | Ht 76.0 in | Wt 203.8 lb

## 2021-02-19 DIAGNOSIS — M5440 Lumbago with sciatica, unspecified side: Secondary | ICD-10-CM | POA: Diagnosis not present

## 2021-02-19 DIAGNOSIS — I723 Aneurysm of iliac artery: Secondary | ICD-10-CM

## 2021-02-19 DIAGNOSIS — I700 Atherosclerosis of aorta: Secondary | ICD-10-CM | POA: Diagnosis not present

## 2021-02-19 DIAGNOSIS — G8929 Other chronic pain: Secondary | ICD-10-CM

## 2021-02-19 DIAGNOSIS — M810 Age-related osteoporosis without current pathological fracture: Secondary | ICD-10-CM | POA: Diagnosis not present

## 2021-02-19 DIAGNOSIS — E785 Hyperlipidemia, unspecified: Secondary | ICD-10-CM | POA: Diagnosis not present

## 2021-02-19 DIAGNOSIS — I2510 Atherosclerotic heart disease of native coronary artery without angina pectoris: Secondary | ICD-10-CM

## 2021-02-19 DIAGNOSIS — C642 Malignant neoplasm of left kidney, except renal pelvis: Secondary | ICD-10-CM

## 2021-02-19 DIAGNOSIS — G709 Myoneural disorder, unspecified: Secondary | ICD-10-CM

## 2021-02-19 DIAGNOSIS — I10 Essential (primary) hypertension: Secondary | ICD-10-CM

## 2021-02-19 NOTE — Assessment & Plan Note (Signed)
Well controlled, no changes to meds. Encouraged heart healthy diet such as the DASH diet and exercise as tolerated.  °

## 2021-02-19 NOTE — Assessment & Plan Note (Signed)
[facility] 

## 2021-02-19 NOTE — Assessment & Plan Note (Signed)
Per pain management at [facility]

## 2021-02-19 NOTE — Progress Notes (Signed)
Patient ID: Christopher Wheeler, male    DOB: 12/30/1945  Age: 75 y.o. MRN: [id number]    Subjective:  Subjective  HPI [name] presents for an office visit and f/u for dyslipidemia today. He complains of weakness and back pain. Pt has a hx of osteoporosis and osteoarthritis. He notes receiving a MRI on 06/20/2020 indicating degenerative changes.  Pt refuses receiving any injections and is requesting for surgical relief. However, he is unable to receive surgical relief due to high nerve function. He reports falling at the end of November 2021, and is currently using a walker. He notes using a cane, however he was dragging his legs while walking. He endorses going to acupuncture and PT and it had help somewhat, however his upper back pain had improved greatly and he is able to uncurl his fingers. He denies any chest pain, SOB, fever, abdominal pain, cough, chills, sore throat, dysuria, urinary incontinence, HA, or N/V/D at this time. Pt is planning on receiving cholesterol and drug test on 03/05/2021. Pt gets his care from [place] and [place] ----- he established himself here so he had a record on file in case he has to go to Er .     Review of Systems  Constitutional:  Negative for chills, fatigue and fever.  HENT:  Negative for ear pain, rhinorrhea, sinus pressure, sinus pain, sore throat and tinnitus.   Eyes:  Negative for pain.  Respiratory:  Negative for cough, shortness of breath and wheezing.   Cardiovascular:  Negative for chest pain.  Gastrointestinal:  Negative for abdominal pain, anal bleeding, constipation, diarrhea, nausea and vomiting.  Genitourinary:  Negative for flank pain.  Musculoskeletal:  Positive for back pain. Negative for neck pain.  Skin:  Negative for rash.  Neurological:  Positive for weakness (bilateral LE). Negative for seizures, light-headedness, numbness and headaches.   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   His family history includes Diabetes in his sister; Diabetes Mellitus II in his paternal grandmother.He reports that he quit smoking about 20 years ago. His smoking use included cigarettes and pipe. He has never used smokeless tobacco. He reports that he does not drink alcohol and does not use drugs.  Current Outpatient Medications on File Prior to Visit  Medication Sig Dispense Refill   acetaminophen (TYLENOL) 500 MG tablet Take 1 tablet (500 mg total) by mouth every 6 (six) hours as needed for moderate pain. 30 tablet 0   aspirin EC 81 MG tablet Take 81 mg by mouth at bedtime.      atorvastatin (LIPITOR) 40 MG tablet Take 40 mg by mouth at bedtime.      B Complex-C (SUPER B COMPLEX PO) Take 1 tablet by mouth daily with lunch.      calcium carbonate (TUMS - DOSED IN MG ELEMENTAL CALCIUM) 500 MG chewable tablet Chew 1 tablet by mouth 2 (two) times daily.     clindamycin (CLEOCIN) 300 MG capsule SMARTSIG:2 Capsule(s) By Mouth     diphenhydrAMINE (BENADRYL) 25 mg capsule Take 50 mg by mouth at bedtime as needed for sleep.  docusate sodium (COLACE) 100 MG capsule Take 100 mg by mouth 3 (three) times daily.      DULoxetine (CYMBALTA) 20 MG capsule Take 20 mg by mouth daily.     fish oil-omega-3 fatty acids 1000 MG capsule Take 2 g by mouth 3 (three) times daily.     gabapentin (NEURONTIN) 300 MG capsule Take 600 mg by mouth 3 (three) times daily.     Garlic Oil 123XX123 MG CAPS Take 1,000 mg by mouth daily.      ketoconazole (NIZORAL) 2 % cream Apply 1 application topically as needed for irritation. Apply to affected area.      ketoconazole (NIZORAL) 2 % shampoo Apply 1 application topically 3 (three) times a week.     Lactobacillus CHEW Chew 2 tablets by mouth 2 (two) times daily.     Methylsulfonylmethane 1000 MG CAPS Take 1,000 mg by mouth 3 (three) times daily.      metoprolol tartrate (LOPRESSOR) 25 MG tablet Take 12.5 mg by mouth 2 (two) times daily.      Multiple Vitamin (MULTIVITAMIN WITH MINERALS) TABS Take 1 tablet by mouth daily.     oxyCODONE (OXY IR/ROXICODONE) 5 MG immediate release tablet Take 1 tablet (5 mg total) by mouth every 6 (six) hours as needed for severe pain. 10 tablet 0   oxyCODONE (OXYCONTIN) 10 mg 12 hr tablet Take 1 tablet (10 mg total) by mouth 3 (three) times daily. 14 tablet 0   polyethylene glycol (MIRALAX / GLYCOLAX) 17 g packet Take 17 g by mouth daily. 14 each 0   senna (SENOKOT) 8.6 MG tablet Take 4 tablets by mouth 2 (two) times daily as needed for constipation.      sildenafil (VIAGRA) 50 MG tablet Take 50 mg by mouth daily as needed for erectile dysfunction.     tiZANidine (ZANAFLEX) 4 MG capsule Take 4 mg by mouth daily.      tiZANidine (ZANAFLEX) 4 MG tablet Take 1 tablet (4 mg total) by mouth daily as needed for muscle spasms. 10 tablet 0   Valerian CAPS Take 2 capsules by mouth at bedtime. 1605 mg capsules      Vitamin D, Cholecalciferol, 1000 units CAPS Take 1,000 Units by mouth 5 (five) times daily.     No current facility-administered medications on file prior to visit.     Objective:  Objective  Physical Exam Vitals and nursing note reviewed.  Constitutional:      General: He is not in acute distress.    Appearance: Normal appearance. He is well-developed. He is not ill-appearing.  HENT:     Head: Normocephalic and atraumatic.     Right Ear: External ear normal.     Left Ear: External ear normal.     Nose: Nose normal.  Eyes:     General:        Right eye: No discharge.        Left eye: No discharge.     Extraocular Movements: Extraocular movements intact.      Pupils: Pupils are equal, round, and reactive to light.  Cardiovascular:     Rate and Rhythm: Normal rate and regular rhythm.     Pulses: Normal pulses.     Heart sounds: Normal heart sounds. No murmur heard.   No friction rub. No gallop.  Pulmonary:     Effort: Pulmonary effort is normal. No respiratory distress.     Breath sounds: Normal breath sounds. No stridor. No wheezing, rhonchi or rales.  Chest:  Chest wall: No tenderness.  Abdominal:     General: Bowel sounds are normal. There is no distension.     Palpations: Abdomen is soft. There is no mass.     Tenderness: There is no abdominal tenderness. There is no guarding or rebound.     Hernia: No hernia is present.  Musculoskeletal:        General: Normal range of motion.     Cervical back: Normal range of motion and neck supple.     Right lower leg: No edema.     Left lower leg: No edema.  Skin:    General: Skin is warm and dry.  Neurological:     Mental Status: He is alert and oriented to person, place, and time.  Psychiatric:        Behavior: Behavior normal.        Thought Content: Thought content normal.   BP 130/80 (BP Location: Right Arm, Patient Position: Sitting, Cuff Size: Normal)   Pulse (!) 59   Temp 98 F (36.7 C) (Oral)   Resp 20   Ht '6\' 4"'$  (1.93 m)   Wt 203 lb 12.8 oz (92.4 kg)   SpO2 95%   BMI 24.81 kg/m  Wt Readings from Last 3 Encounters:  02/19/21 203 lb 12.8 oz (92.4 kg)  06/24/20 194 lb (88 kg)  02/14/20 208 lb (94.3 kg)     Lab Results  Component Value Date   WBC 9.0 06/25/2020   HGB 13.0 06/25/2020   HCT 39.6 06/25/2020   PLT 148 (L) 06/25/2020   GLUCOSE 141 (H) 06/25/2020   ALT 31 06/25/2020   AST 34 06/25/2020   NA 139 06/25/2020   K 4.0 06/25/2020   CL 104 06/25/2020   CREATININE 1.02 06/25/2020   BUN 8 06/25/2020   CO2 24 06/25/2020   TSH 1.182 06/25/2020   INR 1.07 07/09/2012    DG Chest 2 View  Result Date: 06/24/2020 CLINICAL DATA:  Status post fall. EXAM:  CHEST - 2 VIEW COMPARISON:  None. FINDINGS: There is no evidence of acute infiltrate, pleural effusion or pneumothorax. The heart size and mediastinal contours are within normal limits. Degenerative changes seen throughout the thoracic spine. IMPRESSION: No active cardiopulmonary disease. Electronically Signed   By: Virgina Norfolk M.D.   On: 06/24/2020 18:33   DG Pelvis Comp Min 3V  Result Date: 06/25/2020 CLINICAL DATA:  Fall with right-sided pubic fractures EXAM: JUDET PELVIS - 3+ VIEW COMPARISON:  CT 06/24/2020 FINDINGS: Acute nondisplaced fractures of the right superior and inferior pubic rami. No change in alignment from prior CT. Severe osteoarthritis of the right hip. Partially visualized left hip prosthesis. Diffuse osseous demineralization. Aortoiliac stent graft. IMPRESSION: 1. Acute nondisplaced fractures of the right superior and inferior pubic rami. 2. Severe osteoarthritis of the right hip. Electronically Signed   By: Davina Poke D.O.   On: 06/25/2020 13:27   CT Hip Right Wo Contrast  Result Date: 06/24/2020 CLINICAL DATA:  Hip trauma. EXAM: CT OF THE RIGHT HIP WITHOUT CONTRAST TECHNIQUE: Multidetector CT imaging of the right hip was performed according to the standard protocol. Multiplanar CT image reconstructions were also generated. COMPARISON:  None. FINDINGS: Bones/Joint/Cartilage There are advanced degenerative changes of the right femoroacetabular joint. There is no acute displaced fracture or dislocation involving the proximal right femur. There are acute, nondisplaced fractures involving the right inferior and superior pubic rami. Ligaments Suboptimally assessed by CT. Muscles and Tendons There is no acute intramuscular abnormality. Soft tissues  The visualized portions of the bladder are unremarkable. A right external iliac artery stent is noted. IMPRESSION: 1. Acute, nondisplaced fractures involving the right inferior and superior pubic rami. 2. No acute displaced fracture  or dislocation involving the proximal right femur. 3. Advanced degenerative changes of the right femoroacetabular joint. Electronically Signed   By: Constance Holster M.D.   On: 06/24/2020 20:49   DG Hip Unilat W or Wo Pelvis 2-3 Views Right  Result Date: 06/24/2020 CLINICAL DATA:  Status post fall. EXAM: DG HIP (WITH OR WITHOUT PELVIS) 2-3V RIGHT COMPARISON:  None. FINDINGS: There is no evidence of an acute hip fracture or dislocation. An intact total left hip replacement is seen. Marked severity degenerative changes are seen involving the right hip, in the form of joint space narrowing and acetabular sclerosis. A radiopaque vascular stent is seen within the abdominal aorta and bilateral common iliac arteries. IMPRESSION: 1.  Marked severity degenerative changes of the right hip. 2. Intact left hip replacement. Electronically Signed   By: Virgina Norfolk M.D.   On: 06/24/2020 18:32     Assessment & Plan:  Plan    No orders of the defined types were placed in this encounter.   Problem List Items Addressed This Visit       Unprioritized   Atherosclerosis of aorta ([place])   Age-related osteoporosis without current pathological fracture    [facility]        Aneurysm, common iliac artery ([place])    [facility]        CAD (coronary artery disease)    [facility]       Chronic pain    Per pain management at [facility]       Dyslipidemia    Encourage heart healthy diet such as MIND or DASH diet, increase exercise, avoid trans fats, simple carbohydrates and processed foods, consider a krill or fish or flaxseed oil cap daily.        Essential hypertension    Well controlled, no changes to meds. Encouraged heart healthy diet such as the DASH diet and exercise as tolerated.        Neuromuscular disorder ([place])    [facility]       Renal cell carcinoma ([place])    [facility]        Relevant Medications   clindamycin (CLEOCIN) 300 MG capsule   Other Visit Diagnoses      Chronic bilateral low back pain with sciatica, sciatica laterality unspecified    -  Primary   Myoneural disorder, unspecified ([place])   (Chronic)         Follow-up: Return in about 1 year (around 02/19/2022), or if symptoms worsen or fail to improve, for annual exam, fasting-- can schedule wellness with martha over phone this year .   I,Gordon Zheng,acting as a Education administrator for [employer], DO.,have documented all relevant documentation on the behalf of Ann Held, DO,as directed by  Ann Held, DO while in the presence of [place], DO, have reviewed all documentation for this visit. The documentation on 02/19/21 for the exam, diagnosis, procedures, and orders are all accurate and complete.

## 2021-02-19 NOTE — Assessment & Plan Note (Signed)
[facility]

## 2021-02-19 NOTE — Patient Instructions (Signed)

## 2021-02-19 NOTE — Assessment & Plan Note (Signed)
Encourage heart healthy diet such as MIND or DASH diet, increase exercise, avoid trans fats, simple carbohydrates and processed foods, consider a krill or fish or flaxseed oil cap daily.  °

## 2021-02-19 NOTE — Assessment & Plan Note (Signed)
Followed by [facility] and [facility]

## 2021-04-11 LAB — PULMONARY FUNCTION TEST

## 2021-05-14 ENCOUNTER — Telehealth: Payer: Self-pay | Admitting: Family Medicine

## 2021-05-14 NOTE — Telephone Encounter (Signed)
Left message for patient to call back and schedule Medicare Annual Wellness Visit (AWV) in office.   If not able to come in office, please offer to do virtually or by telephone.  Left office number and my jabber #[phone].  Last AWV7/19/2021   Please schedule at anytime with Nurse Health Advisor.  

## 2021-07-25 ENCOUNTER — Telehealth: Payer: Self-pay | Admitting: Family Medicine

## 2021-07-25 NOTE — Telephone Encounter (Signed)
Left message for patient to call back and schedule Medicare Annual Wellness Visit (AWV) in office.   If not able to come in office, please offer to do virtually or by telephone.  Left office number and my jabber #[phone].  Last AWV7/19/2021   Please schedule at anytime with Nurse Health Advisor.  

## 2022-02-25 ENCOUNTER — Encounter: Payer: Self-pay | Admitting: Family Medicine

## 2022-02-25 ENCOUNTER — Ambulatory Visit (INDEPENDENT_AMBULATORY_CARE_PROVIDER_SITE_OTHER): Payer: Medicare Other | Admitting: Family Medicine

## 2022-02-25 DIAGNOSIS — M810 Age-related osteoporosis without current pathological fracture: Secondary | ICD-10-CM | POA: Diagnosis not present

## 2022-02-25 DIAGNOSIS — C642 Malignant neoplasm of left kidney, except renal pelvis: Secondary | ICD-10-CM | POA: Diagnosis not present

## 2022-02-25 DIAGNOSIS — I2510 Atherosclerotic heart disease of native coronary artery without angina pectoris: Secondary | ICD-10-CM | POA: Diagnosis not present

## 2022-02-25 DIAGNOSIS — I723 Aneurysm of iliac artery: Secondary | ICD-10-CM

## 2022-02-25 DIAGNOSIS — G709 Myoneural disorder, unspecified: Secondary | ICD-10-CM | POA: Diagnosis not present

## 2022-02-25 DIAGNOSIS — G8929 Other chronic pain: Secondary | ICD-10-CM

## 2022-02-25 DIAGNOSIS — I10 Essential (primary) hypertension: Secondary | ICD-10-CM

## 2022-02-25 NOTE — Assessment & Plan Note (Signed)
[facility] 

## 2022-02-25 NOTE — Assessment & Plan Note (Signed)
On fosamax Per endo at [facility]

## 2022-02-25 NOTE — Patient Instructions (Signed)
Preventive Care 65 Years and Older, Male Preventive care refers to lifestyle choices and visits with your health care provider that can promote health and wellness. Preventive care visits are also called wellness exams. What can I expect for my preventive care visit? Counseling During your preventive care visit, your health care provider may ask about your: Medical history, including: Past medical problems. Family medical history. History of falls. Current health, including: Emotional well-being. Home life and relationship well-being. Sexual activity. Memory and ability to understand (cognition). Lifestyle, including: Alcohol, nicotine or tobacco, and drug use. Access to firearms. Diet, exercise, and sleep habits. Work and work environment. Sunscreen use. Safety issues such as seatbelt and bike helmet use. Physical exam Your health care provider will check your: Height and weight. These may be used to calculate your BMI (body mass index). BMI is a measurement that tells if you are at a healthy weight. Waist circumference. This measures the distance around your waistline. This measurement also tells if you are at a healthy weight and may help predict your risk of certain diseases, such as type 2 diabetes and high blood pressure. Heart rate and blood pressure. Body temperature. Skin for abnormal spots. What immunizations do I need?  Vaccines are usually given at various ages, according to a schedule. Your health care provider will recommend vaccines for you based on your age, medical history, and lifestyle or other factors, such as travel or where you work. What tests do I need? Screening Your health care provider may recommend screening tests for certain conditions. This may include: Lipid and cholesterol levels. Diabetes screening. This is done by checking your blood sugar (glucose) after you have not eaten for a while (fasting). Hepatitis C test. Hepatitis B test. HIV (human  immunodeficiency virus) test. STI (sexually transmitted infection) testing, if you are at risk. Lung cancer screening. Colorectal cancer screening. Prostate cancer screening. Abdominal aortic aneurysm (AAA) screening. You may need this if you are a current or former smoker. Talk with your health care provider about your test results, treatment options, and if necessary, the need for more tests. Follow these instructions at home: Eating and drinking  Eat a diet that includes fresh fruits and vegetables, whole grains, lean protein, and low-fat dairy products. Limit your intake of foods with high amounts of sugar, saturated fats, and salt. Take vitamin and mineral supplements as recommended by your health care provider. Do not drink alcohol if your health care provider tells you not to drink. If you drink alcohol: Limit how much you have to 0-2 drinks a day. Know how much alcohol is in your drink. In the [place] one drink equals one 12 oz bottle of beer (355 mL), one 5 oz glass of wine (148 mL), or one 1 oz glass of hard liquor (44 mL). Lifestyle Brush your teeth every morning and night with fluoride toothpaste. Floss one time each day. Exercise for at least 30 minutes 5 or more days each week. Do not use any products that contain nicotine or tobacco. These products include cigarettes, chewing tobacco, and vaping devices, such as e-cigarettes. If you need help quitting, ask your health care provider. Do not use drugs. If you are sexually active, practice safe sex. Use a condom or other form of protection to prevent STIs. Take aspirin only as told by your health care provider. Make sure that you understand how much to take and what form to take. Work with your health care provider to find out whether it is safe   and beneficial for you to take aspirin daily. Ask your health care provider if you need to take a cholesterol-lowering medicine (statin). Find healthy ways to manage stress, such  as: Meditation, yoga, or listening to music. Journaling. Talking to a trusted person. Spending time with friends and family. Safety Always wear your seat belt while driving or riding in a vehicle. Do not drive: If you have been drinking alcohol. Do not ride with someone who has been drinking. When you are tired or distracted. While texting. If you have been using any mind-altering substances or drugs. Wear a helmet and other protective equipment during sports activities. If you have firearms in your house, make sure you follow all gun safety procedures. Minimize exposure to UV radiation to reduce your risk of skin cancer. What's next? Visit your health care provider once a year for an annual wellness visit. Ask your health care provider how often you should have your eyes and teeth checked. Stay up to date on all vaccines. This information is not intended to replace advice given to you by your health care provider. Make sure you discuss any questions you have with your health care provider. Document Revised: 01/10/2021 Document Reviewed: 01/10/2021 Elsevier Patient Education  2023 [employer]  

## 2022-02-25 NOTE — Assessment & Plan Note (Signed)
Well controlled, no changes to meds. Encouraged heart healthy diet such as the DASH diet and exercise as tolerated.  °

## 2022-02-25 NOTE — Progress Notes (Addendum)
Subjective:   By signing my name below, I, Cassell Clement, attest that this documentation has been prepared under the direction and in the presence of Donato Schultz DO 02/25/2022   Patient ID: Christopher Wheeler, male    DOB: 03-Jan-1946, 76 y.o.   MRN: [id number]  Chief Complaint  Patient presents with   Follow-up    HPI Patient is in today for an office visit---  [facility] so he has a community dr  No new complaints  He brings [place] records with him today ---these were reviewed with the pt and will be scanned in.    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, C4 lesion)-right side muscle wasting    Past Surgical History:  Procedure Laterality Date   COLONOSCOPY W/ POLYPECTOMY  2016   [facility]   NEPHRECTOMY Left    partial L nephrectomy    TOTAL HIP ARTHROPLASTY  07/21/2012   Procedure: TOTAL HIP ARTHROPLASTY ANTERIOR APPROACH;  Surgeon: [name], MD;  Location: [facility];  Service: Orthopedics;  Laterality: Left;    Family History  Problem Relation Age of Onset   Diabetes Sister    Diabetes Mellitus II Paternal Grandmother     Social History   Socioeconomic History   Marital status: Married    Spouse name: Not on file   Number of children: Not on file   Years of education: Not on file   Highest education level: Not on file  Occupational History   Not on file  Tobacco Use   Smoking status: Former    Types: Cigarettes, Pipe    Quit date: 07/09/2000    Years since quitting: 21.6   Smokeless tobacco: Never  Substance and Sexual Activity   Alcohol use: No    Comment: 1'2007-none since pain management tx.   Drug use: No   Sexual activity: Yes  Other Topics Concern   Not on file  Social  History Narrative   Not on file   Social Determinants of Health   Financial Resource Strain: Low Risk  (02/14/2020)   Overall Financial Resource Strain (CARDIA)    Difficulty of Paying Living Expenses: Not hard at all  Food Insecurity: No Food Insecurity (02/14/2020)   Hunger Vital Sign    Worried About Running Out of Food in the Last Year: Never true    Ran Out of Food in the Last Year: Never true  Transportation Needs: No Transportation Needs (02/14/2020)   PRAPARE - Administrator, Civil Service (Medical): No    Lack of Transportation (Non-Medical): No  Physical Activity: Not on file  Stress: Not on file  Social Connections: Not on file  Intimate Partner Violence: Not on file    Outpatient Medications Prior to Visit  Medication Sig Dispense Refill   aspirin EC 81 MG tablet Take 81 mg by mouth at bedtime.      atorvastatin (LIPITOR) 40 MG tablet Take 40 mg by mouth at bedtime.      B Complex-C (SUPER B COMPLEX PO) Take 1 tablet by mouth daily with lunch.      calcium carbonate (TUMS - DOSED IN MG ELEMENTAL CALCIUM) 500 MG chewable tablet Chew 1 tablet  by mouth 2 (two) times daily.     diphenhydrAMINE (BENADRYL) 25 mg capsule Take 50 mg by mouth at bedtime as needed for sleep.     docusate sodium (COLACE) 100 MG capsule Take 100 mg by mouth 3 (three) times daily.      DULoxetine (CYMBALTA) 20 MG capsule Take 20 mg by mouth daily.     fish oil-omega-3 fatty acids 1000 MG capsule Take 2 g by mouth 3 (three) times daily.     gabapentin (NEURONTIN) 300 MG capsule Take 600 mg by mouth 3 (three) times daily.     Garlic Oil 1000 MG CAPS Take 1,000 mg by mouth daily.      ketoconazole (NIZORAL) 2 % cream Apply 1 application topically as needed for irritation. Apply to affected area.     ketoconazole (NIZORAL) 2 % shampoo Apply 1 application topically 3 (three) times a week.     Lactobacillus CHEW Chew 2 tablets by mouth 2 (two) times daily.     metoprolol tartrate (LOPRESSOR) 25  MG tablet Take 12.5 mg by mouth 2 (two) times daily.      Multiple Vitamin (MULTIVITAMIN WITH MINERALS) TABS Take 1 tablet by mouth daily.     oxyCODONE (OXY IR/ROXICODONE) 5 MG immediate release tablet Take 1 tablet (5 mg total) by mouth every 6 (six) hours as needed for severe pain. 10 tablet 0   oxyCODONE (OXYCONTIN) 10 mg 12 hr tablet Take 1 tablet (10 mg total) by mouth 3 (three) times daily. 14 tablet 0   polyethylene glycol (MIRALAX / GLYCOLAX) 17 g packet Take 17 g by mouth daily. 14 each 0   senna (SENOKOT) 8.6 MG tablet Take 4 tablets by mouth 2 (two) times daily as needed for constipation.      sildenafil (VIAGRA) 50 MG tablet Take 50 mg by mouth daily as needed for erectile dysfunction.     tiZANidine (ZANAFLEX) 4 MG capsule Take 4 mg by mouth daily.      tiZANidine (ZANAFLEX) 4 MG tablet Take 1 tablet (4 mg total) by mouth daily as needed for muscle spasms. 10 tablet 0   Vitamin D, Cholecalciferol, 1000 units CAPS Take 1,000 Units by mouth 5 (five) times daily.     acetaminophen (TYLENOL) 500 MG tablet Take 1 tablet (500 mg total) by mouth every 6 (six) hours as needed for moderate pain. (Patient not taking: Reported on 02/25/2022) 30 tablet 0   clindamycin (CLEOCIN) 300 MG capsule SMARTSIG:2 Capsule(s) By Mouth (Patient not taking: Reported on 02/25/2022)     Methylsulfonylmethane 1000 MG CAPS Take 1,000 mg by mouth 3 (three) times daily.  (Patient not taking: Reported on 02/25/2022)     Valerian CAPS Take 2 capsules by mouth at bedtime. 1605 mg capsules  (Patient not taking: Reported on 02/25/2022)     No facility-administered medications prior to visit.    Allergies  Allergen Reactions   Cefazolin Anaphylaxis    Refractory hypotension and rash intraoperatively, cefazolin vs rocuronium   Cephalosporins Anaphylaxis   Penicillins Anaphylaxis and Rash   Rocuronium Anaphylaxis    Refractory hypotension and rash intraoperatively, rocuronium vs cefazolin   Methadone Other (See  Comments)    respiration and blood pressure depressed    Levofloxacin Other (See Comments)    Per patient:"Possible Permanent Nerve Damage"   Morphine And Related Other (See Comments)    respiration and blood pressure depressed    Other Other (See Comments)    Potato, Hops, Peanut butter: coughing  Simvastatin Nausea And Vomiting   Temazepam Other (See Comments)    Excessive sleepiness   Doxycycline Rash   Erythromycin Rash    Review of Systems  Constitutional:  Negative for fever and malaise/fatigue.  HENT:  Negative for congestion.   Eyes:  Negative for blurred vision.  Respiratory:  Negative for shortness of breath.   Cardiovascular:  Negative for chest pain, palpitations and leg swelling.  Gastrointestinal:  Negative for abdominal pain, blood in stool and nausea.  Genitourinary:  Negative for dysuria and frequency.  Musculoskeletal:  Negative for falls.  Skin:  Negative for rash.  Neurological:  Positive for weakness. Negative for dizziness, loss of consciousness and headaches.  Endo/Heme/Allergies:  Negative for environmental allergies.  Psychiatric/Behavioral:  Negative for depression. The patient is not nervous/anxious.        Objective:    Physical Exam Vitals and nursing note reviewed.  Constitutional:      General: He is not in acute distress.    Appearance: Normal appearance. He is not ill-appearing.  HENT:     Head: Normocephalic and atraumatic.     Right Ear: External ear normal.     Left Ear: External ear normal.  Eyes:     Extraocular Movements: Extraocular movements intact.     Pupils: Pupils are equal, round, and reactive to light.  Cardiovascular:     Rate and Rhythm: Normal rate and regular rhythm.     Heart sounds: Normal heart sounds. No murmur heard.    No gallop.  Pulmonary:     Effort: Pulmonary effort is normal. No respiratory distress.     Breath sounds: Normal breath sounds. No wheezing or rales.  Skin:    General: Skin is warm and dry.   Neurological:     Mental Status: He is alert and oriented to person, place, and time.     Motor: Weakness present.     Gait: Gait abnormal.  Psychiatric:        Judgment: Judgment normal.     BP 118/70 (BP Location: Left Arm, Patient Position: Sitting, Cuff Size: Normal)   Pulse 62   Temp 98.6 F (37 C) (Oral)   Resp 18   Ht 6\' 4"  (1.93 m)   Wt 198 lb (89.8 kg)   SpO2 96%   BMI 24.10 kg/m  Wt Readings from Last 3 Encounters:  02/25/22 198 lb (89.8 kg)  02/19/21 203 lb 12.8 oz (92.4 kg)  06/24/20 194 lb (88 kg)    Diabetic Foot Exam - Simple   No data filed    Lab Results  Component Value Date   WBC 9.0 06/25/2020   HGB 13.0 06/25/2020   HCT 39.6 06/25/2020   PLT 148 (L) 06/25/2020   GLUCOSE 141 (H) 06/25/2020   ALT 31 06/25/2020   AST 34 06/25/2020   NA 139 06/25/2020   K 4.0 06/25/2020   CL 104 06/25/2020   CREATININE 1.02 06/25/2020   BUN 8 06/25/2020   CO2 24 06/25/2020   TSH 1.182 06/25/2020   INR 1.07 07/09/2012    Lab Results  Component Value Date   TSH 1.182 06/25/2020   Lab Results  Component Value Date   WBC 9.0 06/25/2020   HGB 13.0 06/25/2020   HCT 39.6 06/25/2020   MCV 98.5 06/25/2020   PLT 148 (L) 06/25/2020   Lab Results  Component Value Date   NA 139 06/25/2020   K 4.0 06/25/2020   CO2 24 06/25/2020   GLUCOSE 141 (H)  06/25/2020   BUN 8 06/25/2020   CREATININE 1.02 06/25/2020   BILITOT 1.0 06/25/2020   ALKPHOS 48 06/25/2020   AST 34 06/25/2020   ALT 31 06/25/2020   PROT 5.7 (L) 06/25/2020   ALBUMIN 3.1 (L) 06/25/2020   CALCIUM 8.8 (L) 06/25/2020   ANIONGAP 11 06/25/2020   No results found for: "CHOL" No results found for: "HDL" No results found for: "LDLCALC" No results found for: "TRIG" No results found for: "CHOLHDL" No results found for: "HGBA1C"     Assessment & Plan:   Problem List Items Addressed This Visit       Unprioritized   Essential hypertension    Well controlled, no changes to meds. Encouraged  heart healthy diet such as the DASH diet and exercise as tolerated.       CAD (coronary artery disease)    [facility]      Age-related osteoporosis without current pathological fracture    On fosamax Per endo at [facility]      Renal cell carcinoma (HCC)    tx at [facility] 2019      Neuromuscular disorder (HCC)    Stable tx at [facility]      Chronic pain    Per pain management       Aneurysm, common iliac artery [facility])    Per vascular surgeon --- stable  Imaging brought in and scanned in       No orders of the defined types were placed in this encounter.   IDonato Schultz, DO, personally preformed the services described in this documentation.  All medical record entries made by the scribe were at my direction and in my presence.  I have reviewed the chart and discharge instructions (if applicable) and agree that the record reflects my personal performance and is accurate and complete. 02/25/2022   I,Amber Collins,acting as a scribe for [employer], DO.,have documented all relevant documentation on the behalf of Donato Schultz, DO,as directed by  Donato Schultz, DO while in the presence of Donato Schultz, DO.    Donato Schultz, DO

## 2022-02-27 NOTE — Assessment & Plan Note (Signed)
Per pain management.  

## 2022-02-27 NOTE — Assessment & Plan Note (Signed)
Stable tx at [facility]

## 2022-02-27 NOTE — Assessment & Plan Note (Signed)
tx at [facility] 2019

## 2022-02-27 NOTE — Assessment & Plan Note (Signed)
Per vascular surgeon --- stable  Imaging brought in and scanned in

## 2022-04-10 ENCOUNTER — Ambulatory Visit
Payer: No Typology Code available for payment source | Attending: Internal Medicine | Admitting: Rehabilitative and Restorative Service Providers"

## 2022-04-10 ENCOUNTER — Encounter: Payer: Self-pay | Admitting: Rehabilitative and Restorative Service Providers"

## 2022-04-10 ENCOUNTER — Ambulatory Visit: Payer: Medicare Other | Admitting: Rehabilitative and Restorative Service Providers"

## 2022-04-10 ENCOUNTER — Other Ambulatory Visit: Payer: Self-pay

## 2022-04-10 DIAGNOSIS — R262 Difficulty in walking, not elsewhere classified: Secondary | ICD-10-CM | POA: Insufficient documentation

## 2022-04-10 DIAGNOSIS — M6281 Muscle weakness (generalized): Secondary | ICD-10-CM | POA: Insufficient documentation

## 2022-04-10 NOTE — Patient Instructions (Signed)
[facility] Welcome to [facility]! Here you will find all the information you will need regarding your pool therapy. If you have further questions at any time, please call our office at [phone]. After completing your initial evaluation in the [facility], you may be eligible to complete a portion of your therapy in the pool. A typical week of therapy will consist of 1-2 typical physical therapy visits at our [facility] location and an additional session of therapy in the pool located at the [facility] at [address]. [address], [place] [address]. The phone number at the pool site is [phone]. Please call this number if you are running late or need to cancel your appointment.  Aquatic therapy will be offered on Wednesday mornings and Friday afternoons. Each session will last approximately 45 minutes. All scheduling and payments for aquatic therapy sessions, including cancelations, will be done through our [facility] location.  To be eligible for aquatic therapy, these criteria must be met: You must be able to independently change in the locker room and get to the pool deck. A caregiver can come with you to help if needed. There are benches for a caregiver to sit on next to the pool. No one with an open wound is permitted in the pool.  Handicap parking is available in the front and there is a drop off option for even closer accessibility. Please arrive 15 minutes prior to your appointment to prepare for your pool session. You must sign in at the front desk upon your arrival. Please be sure to attend to any toileting needs prior to entering the pool. Locker rooms for changing are available.  There is direct access to the pool deck from the locker room. You can lock your belongings in a locker or bring them with you poolside. Your therapist will greet you on the pool deck. There may be other swimmers in the pool at the  same time but your session is one-on-one with the therapist.   

## 2022-04-10 NOTE — Therapy (Signed)
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, C4 lesion)-right side muscle wasting   Past Surgical History:  Procedure Laterality Date   COLONOSCOPY W/ POLYPECTOMY  2016   [facility]   NEPHRECTOMY Left    partial L nephrectomy    TOTAL HIP ARTHROPLASTY  07/21/2012   Procedure: TOTAL HIP ARTHROPLASTY ANTERIOR APPROACH;  Surgeon: Mauri Pole, MD;  Location: [facility];  Service: Orthopedics;  Laterality: Left;   Patient Active Problem List   Diagnosis Date Noted   Pelvic fracture ([place]) 06/24/2020   Essential hypertension 02/15/2020   Age-related osteoporosis without current pathological fracture 02/15/2020   Dyslipidemia 02/15/2020   Osteopenia 02/08/2019   Chronic pain 11/26/2017   CAD (coronary artery disease) 11/26/2017   Atherosclerosis of aorta ([place]) 11/24/2017   Aneurysm, common iliac artery ([place]) 11/24/2017   Renal cell carcinoma ([place]) 11/24/2017   Osteoarthritis, hip, bilateral 11/21/2016   Expected blood loss anemia 07/22/2012   Overweight (BMI 25.0-29.9) 07/22/2012   S/P left THA, AA 07/21/2012   Neuromuscular disorder ([place]) 07/09/2012    [facility]  REFERRING PROVIDER: Thermon Leyland, DO   REFERRING DIAG: M54.16 (ICD-10-CM) - Radiculopathy, lumbar region   THERAPY DIAG:  Difficulty in walking, not elsewhere classified - Plan: PT plan of care cert/re-cert  Muscle weakness (generalized) - Plan: PT plan of care  cert/re-cert  Rationale for Evaluation and Treatment Rehabilitation  ONSET DATE: since 2002  SUBJECTIVE:   SUBJECTIVE STATEMENT: Pt reports that in 2021 had a fall and fractured his pelvis.  States that he has done PT in the past and he wears boots with one shoe having a lift.  Pt reports that he has pain in both legs, but his right is more severe than his left.  He states that he is willing to try aquatic PT.  PERTINENT HISTORY: HTN, CAD, osteoporosis, renal cell carcinoma, chronic pain  PAIN:  Are you having pain? Yes Reports 8/10 pain in bilateral legs with right being greater than left  PRECAUTIONS: Fall  WEIGHT BEARING RESTRICTIONS No  FALLS:  Has patient fallen in last 6 months? Yes. Number of falls 6  LIVING ENVIRONMENT: Lives with: lives alone Lives in: House/apartment Stairs: No Has following equipment at home: Single point cane, Environmental consultant - 4 wheeled, and Ramped entry  OCCUPATION: Retired  PLOF: Independent  PATIENT GOALS:  To not fall.   OBJECTIVE:   COGNITION:  Overall cognitive status: Within functional limits for tasks assessed     LOWER EXTREMITY ROM:  WFL  LOWER EXTREMITY MMT:  Right LE strength grossly 3+ to 4-/5 Left LE strength grossly 4/5  FUNCTIONAL TESTS:  5 times sit to stand: 18.3 sec with use of UE and walker  GAIT: Distance walked: at least 50 ft Assistive device utilized: Walker - 4 wheeled Level of assistance: Modified independence Comments: Pt able to ambulate at least 10 ft  without assistive device with standby assist with antalgic gait    TODAY'S TREATMENT: 04/10/2022:  Discussed aquatic PT and potential benefits.   PATIENT EDUCATION:  Education details: Educated about aquatic PT and provided printout sheet Person educated: Patient and Doctor, general practice, friend Education method: Explanation and Handouts Education comprehension: verbalized understanding   HOME EXERCISE PROGRAM: To be assigned, as  appropriate  ASSESSMENT:  CLINICAL IMPRESSION: Patient is a 76 y.o. male who was seen today for physical therapy evaluation and treatment for lumbar radiculopathy. Pt is an Scientist, research (life sciences).  Pt presents with difficulty walking and muscle weakness with decreased balance.  Pt would be a good candidate for aquatic PT to assist him with his functional mobility and decreased fall risk.   OBJECTIVE IMPAIRMENTS decreased balance, difficulty walking, decreased strength, and pain.   ACTIVITY LIMITATIONS locomotion level  PARTICIPATION LIMITATIONS: community activity  PERSONAL FACTORS 3+ comorbidities: osteoporosis, chronic pain, HTN  are also affecting patient's functional outcome.   REHAB POTENTIAL: Good  CLINICAL DECISION MAKING: Evolving/moderate complexity  EVALUATION COMPLEXITY: Moderate   GOALS: Goals reviewed with patient? Yes  SHORT TERM GOALS: Target date: 05/01/2022  Patient to be able to perform enter/exit pool, using lift, if needed, with SBA. Baseline: Goal status: INITIAL    LONG TERM GOALS: Target date: 08/21/2022  Pt will be able to participate in an aquatic pool session without increased pain. Baseline:  Goal status: INITIAL  2.  Pt will be able to return demonstration on aquatic PT exercises. Baseline:  Goal status: INITIAL  3.  Patient will increase LE strength to at least 4 to 4+/5 to allow him to ambulate with increased ease. Baseline:  Goal status: INITIAL  4.  Patient will report at least a 50% improvement in pain/balance to decrease his risk of falling. Baseline:  Goal status: INITIAL    PLAN: PT FREQUENCY: 1-2x/week  PT DURATION: other: until Aug 21, 2002 per [facility] authorization  PLANNED INTERVENTIONS: Therapeutic exercises, Therapeutic activity, Neuromuscular re-education, Balance training, Gait training, Patient/Family education, Self Care, Joint mobilization, Joint manipulation, Stair training, Aquatic Therapy, Dry Needling, Electrical stimulation,  Cryotherapy, Moist heat, Taping, Traction, Ultrasound, Manual therapy, and Re-evaluation  PLAN FOR NEXT SESSION: aquatic PT   Jashawna Reever, PT 04/10/2022, 6:22 PM   [facility] [address] [place], [place] [address] Phone # [phone] Fax [phone]

## 2022-04-16 ENCOUNTER — Ambulatory Visit (HOSPITAL_BASED_OUTPATIENT_CLINIC_OR_DEPARTMENT_OTHER): Payer: No Typology Code available for payment source | Attending: Internal Medicine | Admitting: Physical Therapy

## 2022-04-16 ENCOUNTER — Encounter (HOSPITAL_BASED_OUTPATIENT_CLINIC_OR_DEPARTMENT_OTHER): Payer: Self-pay | Admitting: Physical Therapy

## 2022-04-16 DIAGNOSIS — M6281 Muscle weakness (generalized): Secondary | ICD-10-CM | POA: Insufficient documentation

## 2022-04-16 DIAGNOSIS — R262 Difficulty in walking, not elsewhere classified: Secondary | ICD-10-CM | POA: Insufficient documentation

## 2022-04-16 NOTE — Therapy (Signed)
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, C4 lesion)-right side muscle wasting   Past Surgical History:  Procedure Laterality Date   COLONOSCOPY W/ POLYPECTOMY  [place]   [facility]   NEPHRECTOMY Left    partial L nephrectomy    TOTAL HIP ARTHROPLASTY  07/21/2012   Procedure: TOTAL HIP ARTHROPLASTY ANTERIOR APPROACH;  Surgeon: Mauri Pole, MD;  Location: [facility];  Service: Orthopedics;  Laterality: Left;   Patient Active Problem List   Diagnosis Date Noted   Pelvic fracture ([place]) 06/24/2020   Essential hypertension 02/15/2020   Age-related osteoporosis without current pathological fracture 02/15/2020   Dyslipidemia 02/15/2020   Osteopenia 02/08/2019   Chronic pain 11/26/2017   CAD (coronary artery disease) 11/26/2017   Atherosclerosis of aorta ([place]) 11/24/2017   Aneurysm, common iliac artery ([place]) 11/24/2017   Renal cell carcinoma ([place]) 11/24/2017   Osteoarthritis, hip, bilateral 11/21/2016   Expected blood loss anemia 07/22/2012   Overweight (BMI 25.0-29.9) 07/22/2012   S/P left THA, AA 07/21/2012   Neuromuscular disorder ([place]) 07/09/2012    [facility]  REFERRING PROVIDER: Thermon Leyland, DO   REFERRING DIAG: M54.16 (ICD-10-CM) - Radiculopathy, lumbar region   THERAPY DIAG:  No diagnosis found.  Rationale for Evaluation and Treatment  Rehabilitation  ONSET DATE: since 2002  SUBJECTIVE:   SUBJECTIVE STATEMENT: Pt reports he has had medication changes for his nerve pain.  Right now acupuncture is helping, but he is also interested to find out if aquatic therapy can help decrease his pain "below a 5".  He is accompanied by a friend, who is present on deck during the treatment.  Pt reports he hasn't been in water any deeper than his ankles for 40 years.    PERTINENT HISTORY: HTN, CAD, osteoporosis, renal cell carcinoma, chronic pain  PAIN:  Are you having pain? Yes Reports 5/10 pain in Rt thigh  PRECAUTIONS: Fall  WEIGHT BEARING RESTRICTIONS No  FALLS:  Has patient fallen in last 6 months? Yes. Number of falls 6  LIVING ENVIRONMENT: Lives with: lives alone Lives in: House/apartment Stairs: No Has following equipment at home: Single point cane, Environmental consultant - 4 wheeled, and Ramped entry  OCCUPATION: Retired  PLOF: Independent  PATIENT GOALS:  To not fall.   OBJECTIVE:   COGNITION:  Overall cognitive status: Within functional limits for tasks assessed     LOWER EXTREMITY ROM:  WFL  LOWER EXTREMITY MMT:  Right LE strength grossly 3+ to 4-/5 Left LE strength grossly 4/5  FUNCTIONAL TESTS:  5 times sit to stand: 18.3 sec with use of UE and walker  GAIT: Distance walked: at least 50 ft Assistive device utilized: Walker - 4 wheeled Level of assistance: Modified independence Comments: Pt able to ambulate at  least 10 ft without assistive device with standby assist with antalgic gait    TODAY'S TREATMENT: Pt seen for aquatic therapy today.  Treatment took place in water 3.25-4.5 ft in depth at the [facility]. Temp of water was 91.  Pt entered/exited the pool via stairs with supervision with bilat rail.  (Pt uses rollator up to/from stairs)   With use of water walker and CGA (to walker) in 4.5-4.75 ft water:  forward / backward gait, side stepping R/L   - heel/toe raises; hip abdct/ add;   attempts at marching in place (painful to lift RLE); squats; SLS  Seated on bench in water:  LAQ - alternating; unable to march;  hip abdct/ add with knees flexed and ankles touching CGA/Min A for short gait from bench to stairs.    Pt requires the buoyancy and hydrostatic pressure of water for support, and to offload joints by unweighting joint load by at least 50 % in navel deep water and by at least 75-80% in chest to neck deep water.  Viscosity of the water is needed for resistance of strengthening. Water current perturbations provides challenge to standing balance requiring increased core activation.     PATIENT EDUCATION:  Education details: Careers adviser  Person educated: Patient and Doctor, general practice, friend Education method: Explanation and demo  Education comprehension: verbalized understanding   HOME EXERCISE PROGRAM: To be assigned, as appropriate  ASSESSMENT:  CLINICAL IMPRESSION: Pt demonstrates decreased balance in shallow water.  He does best with use of water walker at this time, and increased depth of water.  He reported some increase in Rt thigh (adductor region) with marching in standing (pain up to 6/10).  He requires therapist in water with him for safety.  Pt would be a good candidate for aquatic PT to assist him with his functional mobility and decreased fall risk.  Goals are ongoing.    OBJECTIVE IMPAIRMENTS decreased balance, difficulty walking, decreased strength, and pain.   ACTIVITY LIMITATIONS locomotion level  PARTICIPATION LIMITATIONS: community activity  PERSONAL FACTORS 3+ comorbidities: osteoporosis, chronic pain, HTN  are also affecting patient's functional outcome.   REHAB POTENTIAL: Good  CLINICAL DECISION MAKING: Evolving/moderate complexity  EVALUATION COMPLEXITY: Moderate   GOALS: Goals reviewed with patient? Yes  SHORT TERM GOALS: Target date: 05/01/2022  Patient to be able to perform enter/exit pool, using lift, if needed, with  SBA. Baseline: Goal status: INITIAL    LONG TERM GOALS: Target date: 08/21/2022  Pt will be able to participate in an aquatic pool session without increased pain. Baseline:  Goal status: INITIAL  2.  Pt will be able to return demonstration on aquatic PT exercises. Baseline:  Goal status: INITIAL  3.  Patient will increase LE strength to at least 4 to 4+/5 to allow him to ambulate with increased ease. Baseline:  Goal status: INITIAL  4.  Patient will report at least a 50% improvement in pain/balance to decrease his risk of falling. Baseline:  Goal status: INITIAL    PLAN: PT FREQUENCY: 1-2x/week  PT DURATION: other: until Aug 21, 2002 per [facility] authorization  PLANNED INTERVENTIONS: Therapeutic exercises, Therapeutic activity, Neuromuscular re-education, Balance training, Gait training, Patient/Family education, Self Care, Joint mobilization, Joint manipulation, Stair training, Aquatic Therapy, Dry Needling, Electrical stimulation, Cryotherapy, Moist heat, Taping, Traction, Ultrasound, Manual therapy, and Re-evaluation  PLAN FOR NEXT SESSION: assess response to aquatic PT  Kerin Perna, PTA 04/16/22 1:47 PM [facility] [address] [place], [place], [address] Phone:  [phone]   Fax:  [phone]

## 2022-04-23 ENCOUNTER — Ambulatory Visit (HOSPITAL_BASED_OUTPATIENT_CLINIC_OR_DEPARTMENT_OTHER): Payer: Medicare Other | Admitting: Physical Therapy

## 2022-04-30 ENCOUNTER — Ambulatory Visit (HOSPITAL_BASED_OUTPATIENT_CLINIC_OR_DEPARTMENT_OTHER): Payer: No Typology Code available for payment source | Attending: Internal Medicine | Admitting: Physical Therapy

## 2022-04-30 ENCOUNTER — Encounter (HOSPITAL_BASED_OUTPATIENT_CLINIC_OR_DEPARTMENT_OTHER): Payer: Self-pay | Admitting: Physical Therapy

## 2022-04-30 DIAGNOSIS — M6281 Muscle weakness (generalized): Secondary | ICD-10-CM | POA: Insufficient documentation

## 2022-04-30 DIAGNOSIS — R262 Difficulty in walking, not elsewhere classified: Secondary | ICD-10-CM | POA: Diagnosis not present

## 2022-04-30 NOTE — Therapy (Signed)
OUTPATIENT PHYSICAL THERAPY LOWER EXTREMITY TREATMENT   Patient Name: Christopher Wheeler MRN: [id number] DOB:[birth year], 76 y.o., male Today's Date: 04/30/2022   PT End of Session - 04/30/22 1028     Visit Number 3    Date for PT Re-Evaluation 08/08/22    Authorization Type VA for 15 visits authorized    PT Start Time 1031    PT Stop Time 1115    PT Time Calculation (min) 44 min    Activity Tolerance Patient tolerated treatment well    Behavior During Therapy [facility] for tasks assessed/performed              Past Medical History:  Diagnosis Date   Arthritis    osteoarthritis-hips   Chronic pain disorder 07-09-12   [facility]    Neuromuscular disorder ([place]) 07-09-12   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, C4 lesion)-right side muscle wasting   Past Surgical History:  Procedure Laterality Date   COLONOS[place] W/ [place]OMY  [place]   [facility]   NEPHRECTOMY Left    partial L nephrectomy    TOTAL HIP ARTHROPLASTY  07/21/2012   Procedure: TOTAL HIP ARTHROPLASTY ANTERIOR APPROACH;  Surgeon: Mauri Pole, MD;  Location: [facility];  Service: Orthopedics;  Laterality: Left;   Patient Active Problem List   Diagnosis Date Noted   Pelvic fracture ([place]) 06/24/2020   Essential hypertension 02/15/2020   Age-related osteoporosis without current pathological fracture 02/15/2020   Dyslipidemia 02/15/2020   Osteopenia 02/08/2019   Chronic pain 11/26/2017   CAD (coronary artery disease) 11/26/2017   Atherosclerosis of aorta ([place]) 11/24/2017   Aneurysm, common iliac artery ([place]) 11/24/2017   Renal cell carcinoma ([place]) 11/24/2017   Osteoarthritis, hip, bilateral 11/21/2016   Expected blood loss anemia 07/22/2012   Overweight (BMI 25.0-29.9) 07/22/2012   S/P left THA, AA 07/21/2012   Neuromuscular disorder ([place]) 07/09/2012    [facility]  REFERRING PROVIDER: Thermon Leyland, DO   REFERRING DIAG: M54.16 (ICD-10-CM) -  Radiculopathy, lumbar region   THERAPY DIAG:  Difficulty in walking, not elsewhere classified  Muscle weakness (generalized)  Rationale for Evaluation and Treatment Rehabilitation  ONSET DATE: since 2002  SUBJECTIVE:   SUBJECTIVE STATEMENT: Pt reports he had an overall positive response to last session, slight decrease in discomfort, improvement in balance/confidence   PERTINENT HISTORY: HTN, CAD, osteoporosis, renal cell carcinoma, chronic pain  PAIN:  Are you having pain? Yes Reports 6/10 pain in Rt thigh  PRECAUTIONS: Fall  WEIGHT BEARING RESTRICTIONS No  FALLS:  Has patient fallen in last 6 months? Yes. Number of falls 6  LIVING ENVIRONMENT: Lives with: lives alone Lives in: House/apartment Stairs: No Has following equipment at home: Single point cane, Environmental consultant - 4 wheeled, and Ramped entry  OCCUPATION: Retired  PLOF: Independent  PATIENT GOALS:  To not fall.   OBJECTIVE:   COGNITION:  Overall cognitive status: Within functional limits for tasks assessed     LOWER EXTREMITY ROM:  WFL  LOWER EXTREMITY MMT:  Right LE strength grossly 3+ to 4-/5 Left LE strength grossly 4/5  FUNCTIONAL TESTS:  5 times sit to stand: 18.3 sec with use of UE and walker  GAIT: Distance walked: at least 50 ft Assistive device utilized: Walker - 4 wheeled Level of assistance: Modified independence Comments: Pt able to ambulate at least 10 ft without assistive device with standby assist with antalgic gait    TODAY'S TREATMENT: Pt seen for aquatic therapy today.  Treatment took place in water 3.25-4.5 ft in depth at the [facility]. Temp of water was 91.  Pt entered/exited the pool via stairs with supervision with bilat rail.  (Pt uses rollator up to/from stairs)   With use of water walker and CGA (to walker) close supervision in 4.5-4.75 ft water:  forward / backward gait, side stepping R/L   - heel/toe raises; hip abdct/ add;  marching ; squats; knee  flex; hip extension  Seated on bench in water: STS HHA to indep cues for immediate standing balance            - adductor set using BB x5            -STS with add set x 2 with min asst.   Pt requires the buoyancy and hydrostatic pressure of water for support, and to offload joints by unweighting joint load by at least 50 % in navel deep water and by at least 75-80% in chest to neck deep water.  Viscosity of the water is needed for resistance of strengthening. Water current perturbations provides challenge to standing balance requiring increased core activation.     PATIENT EDUCATION:  Education details: Careers adviser  Person educated: Patient and Doctor, general practice, friend Education method: Explanation and demo  Education comprehension: verbalized understanding   HOME EXERCISE PROGRAM: To be assigned, as appropriate  ASSESSMENT:  CLINICAL IMPRESSION: Decreased guarding with improved confidence in setting. Allowed for good core engagement with minor LOB recovering with CGA to indep. Using water walker majority of session then trialing white barbell at end. Significant decrease in ability to balance using barbell. Pt requires cues for resting/recovery periods.  Visibly he has an increase in pain by end of session but is reluctant to verbalize. Will need instruction going forward on appropriate recovery allowances. Note: right ant tibialis muscle contraction visible with cues for df.     OBJECTIVE IMPAIRMENTS decreased balance, difficulty walking, decreased strength, and pain.   ACTIVITY LIMITATIONS locomotion level  PARTICIPATION LIMITATIONS: community activity  PERSONAL FACTORS 3+ comorbidities: osteoporosis, chronic pain, HTN  are also affecting patient's functional outcome.   REHAB POTENTIAL: Good  CLINICAL DECISION MAKING: Evolving/moderate complexity  EVALUATION COMPLEXITY: Moderate   GOALS: Goals reviewed with patient? Yes  SHORT TERM GOALS: Target date: 05/01/2022  Patient to  be able to perform enter/exit pool, using lift, if needed, with SBA. Baseline: Goal status: INITIAL    LONG TERM GOALS: Target date: 08/21/2022  Pt will be able to participate in an aquatic pool session without increased pain. Baseline:  Goal status: INITIAL  2.  Pt will be able to return demonstration on aquatic PT exercises. Baseline:  Goal status: INITIAL  3.  Patient will increase LE strength to at least 4 to 4+/5 to allow him to ambulate with increased ease. Baseline:  Goal status: INITIAL  4.  Patient will report at least a 50% improvement in pain/balance to decrease his risk of falling. Baseline:  Goal status: INITIAL    PLAN: PT FREQUENCY: 1-2x/week  PT DURATION: other: until Aug 21, 2002 per VA authorization  PLANNED INTERVENTIONS: Therapeutic exercises, Therapeutic activity, Neuromuscular re-education, Balance training, Gait training, Patient/Family education, Self Care, Joint mobilization, Joint manipulation, Stair training, Aquatic Therapy, Dry Needling, Electrical stimulation, Cryotherapy, Moist heat, Taping, Traction, Ultrasound, Manual therapy, and Re-evaluation  PLAN FOR NEXT SESSION: assess response to aquatic PT  Stanton Kidney Tharon Aquas) Owin Vignola MPT 04/30/22 10:29 AM [facility] [address] [place], [place], [address]  Phone: [phone]   Fax:  [phone]

## 2022-05-07 ENCOUNTER — Ambulatory Visit (HOSPITAL_BASED_OUTPATIENT_CLINIC_OR_DEPARTMENT_OTHER): Payer: No Typology Code available for payment source | Admitting: Physical Therapy

## 2022-05-07 ENCOUNTER — Encounter (HOSPITAL_BASED_OUTPATIENT_CLINIC_OR_DEPARTMENT_OTHER): Payer: Self-pay | Admitting: Physical Therapy

## 2022-05-07 DIAGNOSIS — R262 Difficulty in walking, not elsewhere classified: Secondary | ICD-10-CM

## 2022-05-07 DIAGNOSIS — M6281 Muscle weakness (generalized): Secondary | ICD-10-CM

## 2022-05-07 NOTE — Therapy (Signed)
OUTPATIENT PHYSICAL THERAPY LOWER EXTREMITY TREATMENT   Patient Name: Christopher Wheeler MRN: [id number] DOB:03/20/1946, 76 y.o., male Today's Date: 05/07/2022   PT End of Session - 05/07/22 1058     Visit Number 4    Date for PT Re-Evaluation 08/08/22    Authorization Type VA for 15 visits authorized    PT Start Time 1104    PT Stop Time 1145    PT Time Calculation (min) 41 min    Activity Tolerance Patient tolerated treatment well    Behavior During Therapy [facility] for tasks assessed/performed              Past Medical History:  Diagnosis Date   Arthritis    osteoarthritis-hips   Chronic pain disorder 07-09-12   [facility]    Neuromuscular disorder ([place]) 07-09-12   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, C4 lesion)-right side muscle wasting   Past Surgical History:  Procedure Laterality Date   COLONOSCOPY W/ POL[place]OMY  [place]   [facility]   NEPHRECTOMY Left    partial L nephrectomy    TOTAL HIP ARTHROPLASTY  07/21/2012   Procedure: TOTAL HIP ARTHROPLASTY ANTERIOR APPROACH;  Surgeon: Mauri Pole, MD;  Location: [facility];  Service: Orthopedics;  Laterality: Left;   Patient Active Problem List   Diagnosis Date Noted   Pelvic fracture ([place]) 06/24/2020   Essential hypertension 02/15/2020   Age-related osteoporosis without current pathological fracture 02/15/2020   Dyslipidemia 02/15/2020   Osteopenia 02/08/2019   Chronic pain 11/26/2017   CAD (coronary artery disease) 11/26/2017   Atherosclerosis of aorta ([place]) 11/24/2017   Aneurysm, common iliac artery ([place]) 11/24/2017   Renal cell carcinoma ([place]) 11/24/2017   Osteoarthritis, hip, bilateral 11/21/2016   Expected blood loss anemia 07/22/2012   Overweight (BMI 25.0-29.9) 07/22/2012   S/P left THA, AA 07/21/2012   Neuromuscular disorder ([place]) 07/09/2012    [facility]  REFERRING PROVIDER: Thermon Leyland, DO   REFERRING DIAG: M54.16 (ICD-10-CM) -  Radiculopathy, lumbar region   THERAPY DIAG:  Difficulty in walking, not elsewhere classified  Muscle weakness (generalized)  Rationale for Evaluation and Treatment Rehabilitation  ONSET DATE: since 2002  SUBJECTIVE:   SUBJECTIVE STATEMENT: Pt reports that his gait is more becoming more fluid.  He reports that he had "visceral pain" that lasted until Sunday following last session.    PERTINENT HISTORY: HTN, CAD, osteoporosis, renal cell carcinoma, chronic pain  PAIN:  Are you having pain? Yes Reports 6/10 pain in Rt thigh  PRECAUTIONS: Fall  WEIGHT BEARING RESTRICTIONS No  FALLS:  Has patient fallen in last 6 months? Yes. Number of falls 6  LIVING ENVIRONMENT: Lives with: lives alone Lives in: House/apartment Stairs: No Has following equipment at home: Single point cane, Environmental consultant - 4 wheeled, and Ramped entry  OCCUPATION: Retired  PLOF: Independent  PATIENT GOALS:  To not fall.   OBJECTIVE:   COGNITION:  Overall cognitive status: Within functional limits for tasks assessed     LOWER EXTREMITY ROM:  WFL  LOWER EXTREMITY MMT:  Right LE strength grossly 3+ to 4-/5 Left LE strength grossly 4/5  FUNCTIONAL TESTS:  5 times sit to stand: 18.3 sec with use of UE and walker  GAIT: Distance walked: at least 50 ft Assistive device utilized: Walker - 4 wheeled Level of assistance: Modified independence Comments: Pt able to ambulate at least 10 ft without assistive device with standby assist with antalgic gait  TODAY'S TREATMENT: Pt seen for aquatic therapy today.  Treatment took place in water 3.25-4.75 ft in depth at the [facility]. Temp of water was 92.  Pt entered/exited the pool via stairs with supervision with bilat rail.  Pt used HHA from bench to stairs and rollator upon exiting pool.   With use of water walker and SBA in 4.5-4.75 ft water:  forward / backward gait, side stepping R/L; monster walk forward/ backward; marching forward /  backward  Holding wall:     - heel raises; hip abdct/ add; forward kicks (moving through knee flex to ext)  Holding white barbell - progressing to no UE support and SBA:  forward and backward gait Seated on bench in water: alternating LAQ.    - adductor set using BB x 5 sec x 3   - STS with UE support on white barbell and ball between knees x 10   Holding white barbell:  wide stance with row motion;  semi-tandem stance with row (increased pain to 8/10)  Pt given 3 short rest breaks during session:  34mn, 30 sec, 1 min   Pt requires the buoyancy and hydrostatic pressure of water for support, and to offload joints by unweighting joint load by at least 50 % in navel deep water and by at least 75-80% in chest to neck deep water.  Viscosity of the water is needed for resistance of strengthening. Water current perturbations provides challenge to standing balance requiring increased core activation.     PATIENT EDUCATION:  Education details: Aquatics progressions  Person educated: Patient and MDoctor, general practice friend Education method: Explanation and demo  Education comprehension: verbalized understanding   HOME EXERCISE PROGRAM: To be assigned, as appropriate  ASSESSMENT:  CLINICAL IMPRESSION: Decreased guarding with improved confidence in setting. Pt progressed from water walk to barbell to no AD.  Pt reported elimination of pain in RLE while in water, until LE in tandem stance  - this increased RLE pain to 8/10.  Pain reduced with standing rest break afterwards.  Continued need for instruction on appropriate recovery allowances. Goals are ongoing. Pt remains motivated to progress towards goals.      OBJECTIVE IMPAIRMENTS decreased balance, difficulty walking, decreased strength, and pain.   ACTIVITY LIMITATIONS locomotion level  PARTICIPATION LIMITATIONS: community activity  PERSONAL FACTORS 3+ comorbidities: osteoporosis, chronic pain, HTN  are also affecting patient's functional outcome.    REHAB POTENTIAL: Good  CLINICAL DECISION MAKING: Evolving/moderate complexity  EVALUATION COMPLEXITY: Moderate   GOALS: Goals reviewed with patient? Yes  SHORT TERM GOALS: Target date: 05/01/2022  Patient to be able to perform enter/exit pool, using lift, if needed, with SBA. Baseline: Goal status: INITIAL    LONG TERM GOALS: Target date: 08/21/2022  Pt will be able to participate in an aquatic pool session without increased pain. Baseline:  Goal status: INITIAL  2.  Pt will be able to return demonstration on aquatic PT exercises. Baseline:  Goal status: INITIAL  3.  Patient will increase LE strength to at least 4 to 4+/5 to allow him to ambulate with increased ease. Baseline:  Goal status: INITIAL  4.  Patient will report at least a 50% improvement in pain/balance to decrease his risk of falling. Baseline:  Goal status: INITIAL    PLAN: PT FREQUENCY: 1-2x/week  PT DURATION: other: until Aug 21, 2002 per VA authorization  PLANNED INTERVENTIONS: Therapeutic exercises, Therapeutic activity, Neuromuscular re-education, Balance training, Gait training, Patient/Family education, Self Care, Joint mobilization, Joint manipulation, Stair training, Aquatic  Therapy, Dry Needling, Electrical stimulation, Cryotherapy, Moist heat, Taping, Traction, Ultrasound, Manual therapy, and Re-evaluation  PLAN FOR NEXT SESSION: assess response to aquatic PT; continue to progress gait and LE/ core strengthening.  Kerin Perna, PTA 05/07/22 1:50 PM [facility] [address] [place], [place], [address] Phone: [phone]   Fax:  [phone]

## 2022-05-13 NOTE — Therapy (Addendum)
PHYSICAL THERAPY DISCHARGE SUMMARY  Visits from Start of Care: 5  Current functional level related to goals / functional outcomes: Unknown.     Remaining deficits: Chronic Advertising copywriter / Equipment: Discussed eval findings, rehab rationale, aquatic program progression/POC and pools in area. Patient is in agreement     Patient agrees to discharge. Patient goals were not met. Patient is being discharged due to not returning since the last visit.  OUTPATIENT PHYSICAL THERAPY LOWER EXTREMITY TREATMENT   Patient Name: Christopher Wheeler MRN: [id number] DOB:07/02/1946, 76 y.o., male Today's Date: 05/14/2022   PT End of Session - 05/14/22 1351     Visit Number 5    Date for PT Re-Evaluation 08/08/22    [facility] Type [facility] for [facility] authorized    PT Start Time 1031    PT Stop Time 1115    PT Time Calculation (min) 44 min    Activity Tolerance Patient tolerated treatment well    Behavior During Therapy [facility] for tasks assessed/performed               Past Medical History:  Diagnosis Date   Arthritis    osteoarthritis-hips   Chronic pain disorder 07-09-12   [facility]    Neuromuscular disorder (HCC) 07-09-12   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, C4 lesion)-right side muscle wasting   Past Surgical History:  Procedure Laterality Date   COLONOSCOPY W/ POLYPECTOMY  [place] [facility]   NEPHRECTOMY Left    partial L nephrectomy    TOTAL HIP ARTHROPLASTY  07/21/2012   Procedure: TOTAL HIP ARTHROPLASTY ANTERIOR APPROACH;  Surgeon: [name], MD;  Location: [facility];  Service: Orthopedics;  Laterality: Left;   Patient Active Problem List   Diagnosis Date Noted   Pelvic fracture (HCC) 06/24/2020   Essential hypertension 02/15/2020   Age-related osteoporosis without current pathological fracture 02/15/2020   Dyslipidemia 02/15/2020   Osteopenia 02/08/2019   Chronic pain 11/26/2017   CAD  (coronary artery disease) 11/26/2017   Atherosclerosis of aorta (HCC) 11/24/2017   Aneurysm, common iliac artery (HCC) 11/24/2017   Renal cell carcinoma (HCC) 11/24/2017   Osteoarthritis, hip, bilateral 11/21/2016   Expected blood loss anemia 07/22/2012   Overweight (BMI 25.0-29.9) 07/22/2012   S/P left THA, AA 07/21/2012   Neuromuscular disorder (HCC) 07/09/2012    [facility]  REFERRING PROVIDER: Crisoforo Oxford, DO   REFERRING DIAG: M54.16 (ICD-10-CM) - Radiculopathy, lumbar region   THERAPY DIAG:  Difficulty in walking, not elsewhere classified  Muscle weakness (generalized)  Rationale for Evaluation and Treatment Rehabilitation  ONSET DATE: since 2002  SUBJECTIVE:   SUBJECTIVE STATEMENT: Pt reports that all of his movement is more fluid, feels the aquatic therapy is beneficial. He does have a high level of pain with and afterwards but reports he is able to manage.   PERTINENT HISTORY: HTN, CAD, osteoporosis, renal cell carcinoma, chronic pain  PAIN:  Are you having pain? Yes Reports 6/10 pain in Rt thigh  PRECAUTIONS: Fall  WEIGHT BEARING RESTRICTIONS No  FALLS:  Has patient fallen in last 6 months? Yes. Number of falls 6  LIVING ENVIRONMENT: Lives with: lives alone Lives in: House/apartment Stairs: No Has following equipment at home: Single point cane, Environmental consultant - 4 wheeled, and Ramped entry  OCCUPATION: Retired  PLOF: Independent  PATIENT GOALS:  To not fall.   OBJECTIVE:   COGNITION:  Overall cognitive status: Within functional limits for  tasks assessed     LOWER EXTREMITY ROM:  WFL  LOWER EXTREMITY MMT:  Right LE strength grossly 3+ to 4-/5 Left LE strength grossly 4/5  FUNCTIONAL TESTS:  5 times sit to stand: 18.3 sec with use of UE and walker  GAIT: Distance walked: at least 50 ft Assistive device utilized: Walker - 4 wheeled Level of assistance: Modified independence Comments: Pt able to ambulate at least 10 ft without assistive  device with standby assist with antalgic gait    TODAY'S TREATMENT: Pt seen for aquatic therapy today.  Treatment took place in water 3.25-4.75 ft in depth at the [facility] pool. Temp of water was 92.  Pt entered/exited the pool via stairs with supervision with bilat rail.  Pt used HHA from bench to stairs and rollator upon exiting pool.   UE support white barbell and Supervision in 4.5-4.75 ft water:  forward / backward gait, side stepping R/L;  marching forward / backward  Holding white barbell with occasional release to challenge balance:     - heel raises; hip abdct/ add; hip extension; squats; marching Standing holding to wall: hip circles cw and ccw.  Pt does not tolerate SLS on left with completion of hip circles on right. Abdominal engagement: sm blue squoodle pull downs x10. Cues for abd bracing and balance.   Pt requires the buoyancy and hydrostatic pressure of water for support, and to offload joints by unweighting joint load by at least 50 % in navel deep water and by at least 75-80% in chest to neck deep water.  Viscosity of the water is needed for resistance of strengthening. Water current perturbations provides challenge to standing balance requiring increased core activation.     PATIENT EDUCATION:  Education details: Aquatics progressions  Person educated: Patient and Merchant navy officer, friend Education method: Explanation and demo  Education comprehension: verbalized understanding   HOME EXERCISE PROGRAM: To be assigned, as appropriate  ASSESSMENT:  CLINICAL IMPRESSION: No use of water walker today necessary.  Pt amb without difficulty using white barbell and some distance in 4.8 ft without.  He is balance challenged with completing standing exercises unsupported returning to holding to wall for added support when rle pain increases.  He is given at least 3 rest periods to allow for fatigue and increased pain to subside.  He better understands the importance of  resting/recovering intermittently throughout session.  SLS rle with any activity (completing unilateral exercise or for balance training) increases rle pain considerably with pt having little tolerance.  Goals ongoing   OBJECTIVE IMPAIRMENTS decreased balance, difficulty walking, decreased strength, and pain.   ACTIVITY LIMITATIONS locomotion level  PARTICIPATION LIMITATIONS: community activity  PERSONAL FACTORS 3+ comorbidities: osteoporosis, chronic pain, HTN  are also affecting patient's functional outcome.   REHAB POTENTIAL: Good  CLINICAL DECISION MAKING: Evolving/moderate complexity  EVALUATION COMPLEXITY: Moderate   GOALS: Goals reviewed with patient? Yes  SHORT TERM GOALS: Target date: 05/01/2022  Patient to be able to perform enter/exit pool, using lift, if needed, with SBA. Baseline: Goal status: INITIAL    LONG TERM GOALS: Target date: 08/21/2022  Pt will be able to participate in an aquatic pool session without increased pain. Baseline:  Goal status: INITIAL  2.  Pt will be able to return demonstration on aquatic PT exercises. Baseline:  Goal status: INITIAL  3.  Patient will increase LE strength to at least 4 to 4+/5 to allow him to ambulate with increased ease. Baseline:  Goal status: INITIAL  4.  Patient  will report at least a 50% improvement in pain/balance to decrease his risk of falling. Baseline:  Goal status: INITIAL    PLAN: PT FREQUENCY: 1-2x/week  PT DURATION: other: until Aug 21, 2002 per [facility] authorization  PLANNED INTERVENTIONS: Therapeutic exercises, Therapeutic activity, Neuromuscular re-education, Balance training, Gait training, Patient/Family education, Self Care, Joint mobilization, Joint manipulation, Stair training, Aquatic Therapy, Dry Needling, Electrical stimulation, Cryotherapy, Moist heat, Taping, Traction, Ultrasound, Manual therapy, and Re-evaluation  PLAN FOR NEXT SESSION: assess response to aquatic PT; continue to progress  gait and LE/ core strengthening.  [address] [place]) Keyera Hattabaugh MPT 05/14/22 5:59 PM [facility] [address] [place], [place], [address] Phone: [phone]   Fax:  [phone]  Addend Rushie Chestnut) Keatyn Jawad MPT 01/02/23 [phone]

## 2022-05-14 ENCOUNTER — Ambulatory Visit (HOSPITAL_BASED_OUTPATIENT_CLINIC_OR_DEPARTMENT_OTHER): Payer: No Typology Code available for payment source | Admitting: Physical Therapy

## 2022-05-14 ENCOUNTER — Encounter (HOSPITAL_BASED_OUTPATIENT_CLINIC_OR_DEPARTMENT_OTHER): Payer: Self-pay | Admitting: Physical Therapy

## 2022-05-14 DIAGNOSIS — R262 Difficulty in walking, not elsewhere classified: Secondary | ICD-10-CM

## 2022-05-14 DIAGNOSIS — M6281 Muscle weakness (generalized): Secondary | ICD-10-CM

## 2022-05-21 ENCOUNTER — Ambulatory Visit (HOSPITAL_BASED_OUTPATIENT_CLINIC_OR_DEPARTMENT_OTHER): Payer: Medicare Other | Admitting: Physical Therapy

## 2022-05-28 ENCOUNTER — Ambulatory Visit (HOSPITAL_BASED_OUTPATIENT_CLINIC_OR_DEPARTMENT_OTHER): Payer: Medicare Other | Admitting: Physical Therapy

## 2022-06-04 ENCOUNTER — Ambulatory Visit (HOSPITAL_BASED_OUTPATIENT_CLINIC_OR_DEPARTMENT_OTHER): Payer: Medicare Other | Admitting: Physical Therapy

## 2022-06-11 ENCOUNTER — Ambulatory Visit (HOSPITAL_BASED_OUTPATIENT_CLINIC_OR_DEPARTMENT_OTHER): Payer: Medicare Other | Admitting: Physical Therapy

## 2022-06-18 ENCOUNTER — Ambulatory Visit (HOSPITAL_BASED_OUTPATIENT_CLINIC_OR_DEPARTMENT_OTHER): Payer: Medicare Other | Admitting: Physical Therapy

## 2022-06-25 ENCOUNTER — Ambulatory Visit (HOSPITAL_BASED_OUTPATIENT_CLINIC_OR_DEPARTMENT_OTHER): Payer: Medicare Other | Admitting: Physical Therapy

## 2022-07-02 ENCOUNTER — Ambulatory Visit (HOSPITAL_BASED_OUTPATIENT_CLINIC_OR_DEPARTMENT_OTHER): Payer: Medicare Other | Admitting: Physical Therapy

## 2022-07-08 ENCOUNTER — Ambulatory Visit (HOSPITAL_BASED_OUTPATIENT_CLINIC_OR_DEPARTMENT_OTHER): Payer: Medicare Other | Admitting: Physical Therapy

## 2022-07-09 DIAGNOSIS — H52223 Regular astigmatism, bilateral: Secondary | ICD-10-CM | POA: Diagnosis not present

## 2022-07-09 DIAGNOSIS — Z961 Presence of intraocular lens: Secondary | ICD-10-CM | POA: Diagnosis not present

## 2022-07-09 DIAGNOSIS — E7800 Pure hypercholesterolemia, unspecified: Secondary | ICD-10-CM | POA: Diagnosis not present

## 2022-07-09 DIAGNOSIS — H5203 Hypermetropia, bilateral: Secondary | ICD-10-CM | POA: Diagnosis not present

## 2022-07-09 DIAGNOSIS — H43813 Vitreous degeneration, bilateral: Secondary | ICD-10-CM | POA: Diagnosis not present

## 2022-07-09 DIAGNOSIS — H53143 Visual discomfort, bilateral: Secondary | ICD-10-CM | POA: Diagnosis not present

## 2022-07-09 DIAGNOSIS — H524 Presbyopia: Secondary | ICD-10-CM | POA: Diagnosis not present

## 2022-07-16 ENCOUNTER — Ambulatory Visit (HOSPITAL_BASED_OUTPATIENT_CLINIC_OR_DEPARTMENT_OTHER): Payer: Medicare Other | Admitting: Physical Therapy

## 2022-07-23 ENCOUNTER — Ambulatory Visit (HOSPITAL_BASED_OUTPATIENT_CLINIC_OR_DEPARTMENT_OTHER): Payer: Medicare Other | Admitting: Physical Therapy

## 2022-08-05 ENCOUNTER — Other Ambulatory Visit: Payer: Self-pay

## 2022-08-05 ENCOUNTER — Emergency Department (HOSPITAL_COMMUNITY): Payer: Medicare Other

## 2022-08-05 ENCOUNTER — Emergency Department (HOSPITAL_COMMUNITY)
Admission: EM | Admit: 2022-08-05 | Discharge: 2022-08-05 | Disposition: A | Discharge status: Home / Self Care | Payer: Medicare Other | Attending: Emergency Medicine | Admitting: Emergency Medicine

## 2022-08-05 ENCOUNTER — Encounter (HOSPITAL_COMMUNITY): Payer: Self-pay | Admitting: Emergency Medicine

## 2022-08-05 DIAGNOSIS — J439 Emphysema, unspecified: Secondary | ICD-10-CM | POA: Diagnosis not present

## 2022-08-05 DIAGNOSIS — U071 COVID-19: Secondary | ICD-10-CM

## 2022-08-05 DIAGNOSIS — R059 Cough, unspecified: Secondary | ICD-10-CM | POA: Diagnosis not present

## 2022-08-05 DIAGNOSIS — Z7982 Long term (current) use of aspirin: Secondary | ICD-10-CM | POA: Diagnosis not present

## 2022-08-05 LAB — RESP PANEL BY RT-PCR (RSV, FLU A&B, COVID)  RVPGX2
Influenza A by PCR: NEGATIVE
Influenza B by PCR: NEGATIVE
Resp Syncytial Virus by PCR: NEGATIVE
SARS Coronavirus 2 by RT PCR: POSITIVE — AB

## 2022-08-05 NOTE — ED Provider Notes (Signed)
[facility] Provider Note   CSN: [id number] Arrival date & time: 08/05/22  1342     History  No chief complaint on file.   Christopher Wheeler is a 77 y.o. male.  HPI Patient presents with cough.  Feeling weak.  Has around 2 weeks of symptoms.  Some mild green sputum production.  Had been feeling kind of bad for 1 week then worsened.  Reportedly sent in by urgent care because he was going to need blood work.  No known sick contacts.  States he feels that he just needs just to wait it out.   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 Authorizing Provider  aspirin EC 81 MG tablet Take 81 mg by mouth at bedtime.     [provider]  atorvastatin (LIPITOR) 40 MG tablet Take 40 mg by mouth at bedtime.     [provider]  B Complex-C (SUPER B COMPLEX PO) Take 1 tablet by mouth daily with lunch.     [provider]  calcium carbonate (TUMS - DOSED IN MG ELEMENTAL CALCIUM) 500 MG chewable tablet Chew 1 tablet by mouth 2 (two) times daily.    [provider]  diphenhydrAMINE (BENADRYL) 25 mg capsule Take 50 mg by mouth at bedtime as needed for sleep.    [provider]  docusate sodium (COLACE) 100 MG capsule Take 100 mg by mouth 3 (three) times daily.     [provider]  DULoxetine (CYMBALTA) 20 MG capsule Take 20 mg by mouth daily.    [provider]  fish oil-omega-3 fatty acids 1000 MG capsule Take 2 g by mouth 3 (three) times daily.    [provider]  gabapentin (NEURONTIN) 300 MG capsule Take 600 mg by mouth 3 (three) times daily.     [provider]  Garlic Oil 7017 MG CAPS Take 1,000 mg by mouth daily.     [provider]  glucosamine-chondroitin 500-400 MG tablet Take 1 tablet by mouth 3 (three) times daily.    [provider]  ketoconazole (NIZORAL) 2 % cream Apply 1 application topically as needed for irritation. Apply to affected area.    [provider]  ketoconazole (NIZORAL) 2 % shampoo Apply 1 application topically 3 (three) times a week.    [provider]  Lactobacillus CHEW Chew 2 tablets by mouth 2 (two) times daily.    [provider]  metoprolol tartrate (LOPRESSOR) 25 MG tablet Take 12.5 mg by mouth 2 (two) times daily.     [provider]  Multiple Vitamin (MULTIVITAMIN WITH MINERALS) TABS Take 1 tablet by mouth daily.    [provider]  oxyCODONE (OXY IR/ROXICODONE) 5 MG immediate release tablet Take 1 tablet (5 mg total) by mouth every 6 (six) hours as needed for severe pain. 06/28/20   Arrien, Jimmy Picket, MD  oxyCODONE (OXYCONTIN) 10 mg 12 hr tablet Take 1 tablet (10 mg total) by mouth 3 (three) times daily. 06/28/20   Arrien, Jimmy Picket, MD  polyethylene glycol (MIRALAX / GLYCOLAX) 17 g packet Take 17 g by mouth daily. 06/28/20  Arrien, Jimmy Picket, MD  senna (SENOKOT) 8.6 MG tablet Take 4 tablets by mouth 2 (two) times daily as needed for constipation.     [provider]  sildenafil (VIAGRA) 50 MG tablet Take 50 mg by mouth daily as needed for erectile dysfunction.    [provider]  tiZANidine (ZANAFLEX) 4 MG capsule Take 4 mg by mouth daily.     [provider]  tiZANidine (ZANAFLEX) 4 MG tablet Take 1 tablet (4 mg total) by mouth daily as needed for muscle spasms. 06/28/20   Arrien, Jimmy Picket, MD  Vitamin D, Cholecalciferol, 1000 units CAPS Take 1,000 Units by mouth 5 (five) times daily.    [provider]      Allergies    Cefazolin, Cephalosporins, Penicillins, Rocuronium,  Methadone, Levofloxacin, Morphine and related, Other, Simvastatin, Temazepam, Doxycycline, and Erythromycin    Review of Systems   Review of Systems  Physical Exam Updated Vital Signs BP (!) 166/83   Pulse 79   Temp (!) 97.4 F (36.3 C) (Oral)   Resp 14   SpO2 98%  Physical Exam Vitals and nursing note reviewed.  HENT:     Head: Normocephalic.  Eyes:     Extraocular Movements: Extraocular movements intact.  Cardiovascular:     Rate and Rhythm: Regular rhythm.  Pulmonary:     Comments: Mildly hard breath sounds without focal findings. Abdominal:     Tenderness: There is no abdominal tenderness.  Skin:    General: Skin is warm.     Capillary Refill: Capillary refill takes less than 2 seconds.  Neurological:     Mental Status: He is alert and oriented to person, place, and time.     ED Results / Procedures / Treatments   Labs (all labs ordered are listed, but only abnormal results are displayed) Labs Reviewed  RESP PANEL BY RT-PCR (RSV, FLU A&B, COVID)  [id number] - Abnormal; Notable for the following components:      Result Value   SARS Coronavirus 2 by RT PCR POSITIVE (*)    All other components within normal limits    EKG None  Radiology DG Chest Portable 1 View  Result Date: 08/05/2022 CLINICAL DATA:  Cough EXAM: PORTABLE CHEST 1 VIEW COMPARISON:  Chest x-ray 06/24/2020 FINDINGS: Emphysematous changes are again seen. There is no focal lung infiltrate, pleural effusion or pneumothorax. The cardiomediastinal silhouette is within normal limits. No acute fractures are seen. IMPRESSION: No active disease. Emphysema. Electronically Signed   By: Ronney Asters M.D.   On: 08/05/2022 16:04    Procedures Procedures    Medications Ordered in ED Medications - No data to display  ED Course/ Medical Decision Making/ A&P                           Medical Decision Making Amount and/or Complexity of Data Reviewed Radiology: ordered.   Patient is short of breath with cough.   Differential diagnosis includes, viral syndrome, CHF.  COVID test has come back positive.  Not a Paxlovid candidate due to 2 weeks of symptoms.  However is somewhat worrisome that there could be a superinfection with bacterial pneumonia since he worsen after week.  Sent in from urgent care.  However do not feel that he needs blood work this time.  Will get x-ray however.  Should be able to discharge home.  X-ray shows no pneumonia.  Will discharge home.        Final  Clinical Impression(s) / ED Diagnoses Final diagnoses:  [id number]    Rx / DC Orders ED Discharge Orders     None         Davonna Belling, MD 08/05/22 1623

## 2022-08-05 NOTE — ED Triage Notes (Signed)
Pt c/o prod cough, balance off, cold x 2 weeks. Decreases appetite.  denies pain, color wnl. A/o. NAD.

## 2022-08-15 ENCOUNTER — Telehealth: Payer: Self-pay

## 2022-08-15 NOTE — Telephone Encounter (Signed)
     Patient  visit on 08/05/2022  at [facility] was for COVID-19.  Have you been able to follow up with your primary care physician? No. Patient is feeling better.  The patient was or was not able to obtain any needed medicine or equipment. No medications prescribed.  Are there diet recommendations that you are having difficulty following?No  Patient expresses understanding of discharge instructions and education provided has no other needs at this time.    [facility] Resource Care Guide   [email or web address]  ?? [phone]   Website: [email or web address]  [email or web address]

## 2022-09-03 LAB — LAB REPORT - SCANNED: A1c: 6

## 2023-03-03 ENCOUNTER — Ambulatory Visit (INDEPENDENT_AMBULATORY_CARE_PROVIDER_SITE_OTHER): Payer: Medicare Other | Admitting: Family Medicine

## 2023-03-03 ENCOUNTER — Encounter: Payer: Self-pay | Admitting: Family Medicine

## 2023-03-03 VITALS — BP 118/60 | HR 56 | Temp 98.4°F | Resp 20 | Ht 76.0 in | Wt 179.2 lb

## 2023-03-03 DIAGNOSIS — I2510 Atherosclerotic heart disease of native coronary artery without angina pectoris: Secondary | ICD-10-CM | POA: Diagnosis not present

## 2023-03-03 DIAGNOSIS — G709 Myoneural disorder, unspecified: Secondary | ICD-10-CM | POA: Diagnosis not present

## 2023-03-03 DIAGNOSIS — C642 Malignant neoplasm of left kidney, except renal pelvis: Secondary | ICD-10-CM | POA: Diagnosis not present

## 2023-03-03 DIAGNOSIS — M810 Age-related osteoporosis without current pathological fracture: Secondary | ICD-10-CM

## 2023-03-03 DIAGNOSIS — E785 Hyperlipidemia, unspecified: Secondary | ICD-10-CM | POA: Diagnosis not present

## 2023-03-03 DIAGNOSIS — I723 Aneurysm of iliac artery: Secondary | ICD-10-CM | POA: Diagnosis not present

## 2023-03-03 DIAGNOSIS — I10 Essential (primary) hypertension: Secondary | ICD-10-CM | POA: Diagnosis not present

## 2023-03-03 DIAGNOSIS — G8929 Other chronic pain: Secondary | ICD-10-CM

## 2023-03-03 MED ORDER — ALENDRONATE SODIUM 70 MG PO TABS: 70.0000 mg | ORAL_TABLET | ORAL | Status: AC

## 2023-03-03 NOTE — Assessment & Plan Note (Signed)
Outpatient Encounter Medications as of 03/03/2023  Medication Sig Note   alendronate (FOSAMAX) 70 MG tablet Take 1 tablet (70 mg total) by mouth every 7 (seven) days. Take with a full glass of water on an empty stomach.    aspirin EC 81 MG tablet Take 81 mg by mouth at bedtime.     atorvastatin (LIPITOR) 40 MG tablet Take 40 mg by mouth at bedtime.     B Complex-C (SUPER B COMPLEX PO) Take 1 tablet by mouth daily with lunch.     calcium carbonate (TUMS - DOSED IN MG ELEMENTAL CALCIUM) 500 MG chewable tablet Chew 1 tablet by mouth 2 (two) times daily.    diphenhydrAMINE (BENADRYL) 25 mg capsule Take 50 mg by mouth at bedtime as needed for sleep.    docusate sodium (COLACE) 100 MG capsule Take 100 mg by mouth 3 (three) times daily.     DULoxetine (CYMBALTA) 20 MG capsule Take 20 mg by mouth daily. 06/25/2020: Per pt, needs 30mg  now. States he explained this to hospitalist   fish oil-omega-3 fatty acids 1000 MG capsule Take 2 g by mouth 3 (three) times daily.    gabapentin (NEURONTIN) 300 MG capsule Take 600 mg by mouth 3 (three) times daily.    Garlic Oil 1000 MG CAPS Take 1,000 mg by mouth daily.     glucosamine-chondroitin 500-400 MG tablet Take 1 tablet by mouth 3 (three) times daily.    ketoconazole (NIZORAL) 2 % cream Apply 1 application topically as needed for irritation. Apply to affected area.    ketoconazole (NIZORAL) 2 % shampoo Apply 1 application topically 3 (three) times a week.    Lactobacillus CHEW Chew 2 tablets by mouth 2 (two) times daily.    metoprolol tartrate (LOPRESSOR) 25 MG tablet Take 12.5 mg by mouth 2 (two) times daily.     Multiple Vitamin (MULTIVITAMIN WITH MINERALS) TABS Take 1 tablet by mouth daily.    oxyCODONE (OXY IR/ROXICODONE) 5 MG immediate release tablet Take 1 tablet (5 mg total) by mouth every 6 (six) hours as needed for severe pain.    oxyCODONE (OXYCONTIN) 10 mg 12 hr tablet Take 1 tablet (10 mg total) by mouth 3 (three) times daily.    polyethylene glycol  (MIRALAX / GLYCOLAX) 17 g packet Take 17 g by mouth daily.    senna (SENOKOT) 8.6 MG tablet Take 4 tablets by mouth 2 (two) times daily as needed for constipation.     sildenafil (VIAGRA) 50 MG tablet Take 50 mg by mouth daily as needed for erectile dysfunction.    tiZANidine (ZANAFLEX) 4 MG capsule Take 4 mg by mouth daily.     tiZANidine (ZANAFLEX) 4 MG tablet Take 1 tablet (4 mg total) by mouth daily as needed for muscle spasms.    Vitamin D, Cholecalciferol, 1000 units CAPS Take 1,000 Units by mouth 5 (five) times daily.    No facility-administered encounter medications on file as of 03/03/2023.

## 2023-03-03 NOTE — Patient Instructions (Signed)
Preventive Care 65 Years and Older, Male Preventive care refers to lifestyle choices and visits with your health care provider that can promote health and wellness. Preventive care visits are also called wellness exams. What can I expect for my preventive care visit? Counseling During your preventive care visit, your health care provider may ask about your: Medical history, including: Past medical problems. Family medical history. History of falls. Current health, including: Emotional well-being. Home life and relationship well-being. Sexual activity. Memory and ability to understand (cognition). Lifestyle, including: Alcohol, nicotine or tobacco, and drug use. Access to firearms. Diet, exercise, and sleep habits. Work and work environment. Sunscreen use. Safety issues such as seatbelt and bike helmet use. Physical exam Your health care provider will check your: Height and weight. These may be used to calculate your BMI (body mass index). BMI is a measurement that tells if you are at a healthy weight. Waist circumference. This measures the distance around your waistline. This measurement also tells if you are at a healthy weight and may help predict your risk of certain diseases, such as type 2 diabetes and high blood pressure. Heart rate and blood pressure. Body temperature. Skin for abnormal spots. What immunizations do I need?  Vaccines are usually given at various ages, according to a schedule. Your health care provider will recommend vaccines for you based on your age, medical history, and lifestyle or other factors, such as travel or where you work. What tests do I need? Screening Your health care provider may recommend screening tests for certain conditions. This may include: Lipid and cholesterol levels. Diabetes screening. This is done by checking your blood sugar (glucose) after you have not eaten for a while (fasting). Hepatitis C test. Hepatitis B test. HIV (human  immunodeficiency virus) test. STI (sexually transmitted infection) testing, if you are at risk. Lung cancer screening. Colorectal cancer screening. Prostate cancer screening. Abdominal aortic aneurysm (AAA) screening. You may need this if you are a current or former smoker. Talk with your health care provider about your test results, treatment options, and if necessary, the need for more tests. Follow these instructions at home: Eating and drinking  Eat a diet that includes fresh fruits and vegetables, whole grains, lean protein, and low-fat dairy products. Limit your intake of foods with high amounts of sugar, saturated fats, and salt. Take vitamin and mineral supplements as recommended by your health care provider. Do not drink alcohol if your health care provider tells you not to drink. If you drink alcohol: Limit how much you have to 0-2 drinks a day. Know how much alcohol is in your drink. In the [place] one drink equals one 12 oz bottle of beer (355 mL), one 5 oz glass of wine (148 mL), or one 1 oz glass of hard liquor (44 mL). Lifestyle Brush your teeth every morning and night with fluoride toothpaste. Floss one time each day. Exercise for at least 30 minutes 5 or more days each week. Do not use any products that contain nicotine or tobacco. These products include cigarettes, chewing tobacco, and vaping devices, such as e-cigarettes. If you need help quitting, ask your health care provider. Do not use drugs. If you are sexually active, practice safe sex. Use a condom or other form of protection to prevent STIs. Take aspirin only as told by your health care provider. Make sure that you understand how much to take and what form to take. Work with your health care provider to find out whether it is safe   and beneficial for you to take aspirin daily. Ask your health care provider if you need to take a cholesterol-lowering medicine (statin). Find healthy ways to manage stress, such  as: Meditation, yoga, or listening to music. Journaling. Talking to a trusted person. Spending time with friends and family. Safety Always wear your seat belt while driving or riding in a vehicle. Do not drive: If you have been drinking alcohol. Do not ride with someone who has been drinking. When you are tired or distracted. While texting. If you have been using any mind-altering substances or drugs. Wear a helmet and other protective equipment during sports activities. If you have firearms in your house, make sure you follow all gun safety procedures. Minimize exposure to UV radiation to reduce your risk of skin cancer. What's next? Visit your health care provider once a year for an annual wellness visit. Ask your health care provider how often you should have your eyes and teeth checked. Stay up to date on all vaccines. This information is not intended to replace advice given to you by your health care provider. Make sure you discuss any questions you have with your health care provider. Document Revised: 01/10/2021 Document Reviewed: 01/10/2021 Elsevier Patient Education  2024 [employer]  

## 2023-03-03 NOTE — Progress Notes (Signed)
Established Patient Office Visit  Subjective   Patient ID: Christopher Wheeler, male    DOB: 12/17/1945  Age: 77 y.o. MRN: [id number]  Chief Complaint  Patient presents with   Hypertension   Hyperlipidemia   Follow-up    HPI Discussed the use of AI scribe software for clinical note transcription with the patient, who gave verbal consent to proceed.  History of Present Illness   The patient, with a history of multiple medical conditions, presents for a routine follow-up. The patient's primary ongoing issues include thinning of the bones, chronic pain, and debility affecting mobility. Despite these challenges, the patient reports that other organ systems, including the lungs, heart, and kidneys, are stable.  The patient is currently on Alendronate for bone health and reports no new changes in medication. The patient also mentions a significant intentional weight loss, from over 170 pounds to a target weight of 170 pounds, in an effort to manage pain levels. The patient reports that this weight loss has been successful in reducing pain.  The patient also mentions ongoing surveillance for a lung condition and an abdominal aneurysm, with the next lung scan scheduled for September or October. The patient wears a fall detection device due to concerns about potential injury from a fall.      Patient Active Problem List   Diagnosis Date Noted   Pelvic fracture (HCC) 06/24/2020   Essential hypertension 02/15/2020   Age-related osteoporosis without current pathological fracture 02/15/2020   Dyslipidemia 02/15/2020   Osteopenia 02/08/2019   Chronic pain 11/26/2017   CAD (coronary artery disease) 11/26/2017   Atherosclerosis of aorta (HCC) 11/24/2017   Aneurysm, common iliac artery (HCC) 11/24/2017   Renal cell carcinoma (HCC) 11/24/2017   Osteoarthritis, hip, bilateral 11/21/2016   Expected blood loss anemia 07/22/2012   Overweight (BMI 25.0-29.9) 07/22/2012   S/P left THA, AA 07/21/2012    Neuromuscular disorder (HCC) 07/09/2012   Past Medical History:  Diagnosis Date   Arthritis    osteoarthritis-hips   Chronic pain disorder 07-09-12   [facility]    Neuromuscular disorder (HCC) 07-09-12   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, C4 lesion)-right side muscle wasting   Past Surgical History:  Procedure Laterality Date   COLONOSCOPY W/ POLYPECTOMY  2016   [facility]   NEPHRECTOMY Left    partial L nephrectomy    TOTAL HIP ARTHROPLASTY  07/21/2012   Procedure: TOTAL HIP ARTHROPLASTY ANTERIOR APPROACH;  Surgeon: [name], MD;  Location: [facility];  Service: Orthopedics;  Laterality: Left;   Social History   Tobacco Use   Smoking status: Former    Current packs/day: 0.00    Types: Cigarettes, Pipe    Quit date: 07/09/2000    Years since quitting: 22.6   Smokeless tobacco: Never  Substance Use Topics   Alcohol use: No    Comment: 1'2007-none since pain management tx.   Drug use: No   Social History   Socioeconomic History   Marital status: Married    Spouse name: Not on file   Number of children: Not on file   Years of education: Not on file   Highest education level: Not on file  Occupational History   Not on file  Tobacco Use   Smoking status: Former    Current packs/day: 0.00    Types: Cigarettes, Pipe    Quit date: 07/09/2000    Years since quitting: 22.6   Smokeless tobacco: Never  Substance and Sexual Activity   Alcohol use: No    Comment: 1'2007-none since pain management tx.   Drug use: No   Sexual activity: Yes  Other Topics Concern   Not on file  Social History Narrative   Not on file   Social Determinants of Health   Financial Resource Strain: Low Risk  (02/14/2020)   Overall Financial Resource Strain (CARDIA)    Difficulty of Paying Living Expenses: Not hard at all  Food Insecurity: No Food Insecurity (02/14/2020)   Hunger Vital Sign    Worried About Running  Out of Food in the Last Year: Never true    Ran Out of Food in the Last Year: Never true  Transportation Needs: No Transportation Needs (02/14/2020)   PRAPARE - Administrator, Civil Service (Medical): No    Lack of Transportation (Non-Medical): No  Physical Activity: Inactive (07/02/2019)   Received from [facility] visits prior to 09/28/2022., [facility] visits prior to 09/28/2022.   Exercise Vital Sign    Days of Exercise per Week: 0 days    Minutes of Exercise per Session: 0 min  Stress: No Stress Concern Present (07/02/2019)   Received from [facility] visits prior to 09/28/2022., [facility] visits prior to 09/28/2022.   [employer] of Occupational Health - Occupational Stress Questionnaire    Feeling of Stress : Only a little  Social Connections: Moderately Isolated (07/02/2019)   Received from [facility] visits prior to 09/28/2022., [facility] visits prior to 09/28/2022.   Social Connection and Isolation Panel [NHANES]    Frequency of Communication with Friends and Family: Three times a week    Frequency of Social Gatherings with Friends and Family: Never    Attends Religious Services: More than 4 times per year    Active Member of Clubs or Organizations: No    Attends Banker Meetings: Never    Marital Status: Widowed  Intimate Partner Violence: Unknown (07/02/2019)   [facility] visits prior to 09/28/2022., [facility] visits prior to 09/28/2022.   Humiliation, Afraid, Rape, and Kick questionnaire    Fear of Current or Ex-Partner: Not asked    Emotionally Abused: Not asked    Physically Abused: Not asked    Sexually Abused: Not asked   Family Status  Relation Name Status   Sister  Alive   PGM  (Not Specified)  No partnership data on file   Family History  Problem  Relation Age of Onset   Diabetes Sister    Diabetes Mellitus II Paternal Grandmother    Allergies  Allergen Reactions   Cefazolin Anaphylaxis    Refractory hypotension and rash intraoperatively, cefazolin vs rocuronium   Cephalosporins Anaphylaxis   Penicillins Anaphylaxis and Rash   Rocuronium Anaphylaxis    Refractory hypotension and rash intraoperatively, rocuronium vs cefazolin   Methadone Other (See Comments)    respiration and blood pressure depressed    Levofloxacin Other (See Comments)    Per patient:"Possible Permanent Nerve Damage"   Morphine And Codeine Other (See Comments)    respiration and blood pressure depressed    Other Other (See Comments)    Potato, Hops, Peanut butter: coughing   Simvastatin Nausea And Vomiting   Temazepam Other (See Comments)    Excessive sleepiness   Doxycycline Rash   Erythromycin Rash  ROS    Objective:     BP 118/60 (BP Location: Left Arm, Patient Position: Sitting, Cuff Size: Normal)   Pulse (!) 56   Temp 98.4 F (36.9 C) (Oral)   Resp 20   Ht 6\' 4"  (1.93 m)   Wt 179 lb 3.2 oz (81.3 kg)   SpO2 96%   BMI 21.81 kg/m  BP Readings from Last 3 Encounters:  03/03/23 118/60  08/05/22 (!) 166/83  02/25/22 118/70   Wt Readings from Last 3 Encounters:  03/03/23 179 lb 3.2 oz (81.3 kg)  02/25/22 198 lb (89.8 kg)  02/19/21 203 lb 12.8 oz (92.4 kg)   SpO2 Readings from Last 3 Encounters:  03/03/23 96%  08/05/22 98%  02/25/22 96%      Physical Exam Vitals and nursing note reviewed.  Constitutional:      General: He is not in acute distress.    Appearance: Normal appearance. He is well-developed.  HENT:     Head: Normocephalic and atraumatic.  Eyes:     General: No scleral icterus.       Right eye: No discharge.        Left eye: No discharge.  Cardiovascular:     Rate and Rhythm: Normal rate and regular rhythm.     Heart sounds: No murmur heard. Pulmonary:     Effort: Pulmonary effort is normal. No respiratory  distress.     Breath sounds: Normal breath sounds.  Musculoskeletal:        General: Normal range of motion.     Cervical back: Normal range of motion and neck supple.     Right lower leg: No edema.     Left lower leg: No edema.  Skin:    General: Skin is warm and dry.  Neurological:     General: No focal deficit present.     Mental Status: He is alert and oriented to person, place, and time.  Psychiatric:        Mood and Affect: Mood normal.        Behavior: Behavior normal.        Thought Content: Thought content normal.        Judgment: Judgment normal.      No results found for any visits on 03/03/23.  Last CBC Lab Results  Component Value Date   WBC 9.0 06/25/2020   HGB 13.0 06/25/2020   HCT 39.6 06/25/2020   MCV 98.5 06/25/2020   MCH 32.3 06/25/2020   RDW 13.3 06/25/2020   PLT 148 (L) 06/25/2020   Last metabolic panel Lab Results  Component Value Date   GLUCOSE 141 (H) 06/25/2020   NA 139 06/25/2020   K 4.0 06/25/2020   CL 104 06/25/2020   CO2 24 06/25/2020   BUN 8 06/25/2020   CREATININE 1.02 06/25/2020   GFRNONAA >60 06/25/2020   CALCIUM 8.8 (L) 06/25/2020   PROT 5.7 (L) 06/25/2020   ALBUMIN 3.1 (L) 06/25/2020   BILITOT 1.0 06/25/2020   ALKPHOS 48 06/25/2020   AST 34 06/25/2020   ALT 31 06/25/2020   ANIONGAP 11 06/25/2020   Last lipids No results found for: "CHOL", "HDL", "LDLCALC", "LDLDIRECT", "TRIG", "CHOLHDL" Last hemoglobin A1c No results found for: "HGBA1C" Last thyroid functions Lab Results  Component Value Date   TSH 1.182 06/25/2020   Last vitamin D Lab Results  Component Value Date   VD25OH 54.21 06/25/2020   Last vitamin B12 and Folate No results found for: "VITAMINB12", "FOLATE"    The  ASCVD Risk score (Arnett DK, et al., 2019) failed to calculate for the following reasons:   Cannot find a previous total cholesterol lab    Assessment & Plan:   Problem List Items Addressed This Visit       Unprioritized   Renal cell  carcinoma (HCC)   Neuromuscular disorder (HCC)   Chronic pain   Essential hypertension    Well controlled, no changes to meds. Encouraged heart healthy diet such as the DASH diet and exercise as tolerated.        Dyslipidemia    Encourage heart healthy diet such as MIND or DASH diet, increase exercise, avoid trans fats, simple carbohydrates and processed foods, consider a krill or fish or flaxseed oil cap daily.        CAD (coronary artery disease)    Outpatient Encounter Medications as of 03/03/2023  Medication Sig Note   alendronate (FOSAMAX) 70 MG tablet Take 1 tablet (70 mg total) by mouth every 7 (seven) days. Take with a full glass of water on an empty stomach.    aspirin EC 81 MG tablet Take 81 mg by mouth at bedtime.     atorvastatin (LIPITOR) 40 MG tablet Take 40 mg by mouth at bedtime.     B Complex-C (SUPER B COMPLEX PO) Take 1 tablet by mouth daily with lunch.     calcium carbonate (TUMS - DOSED IN MG ELEMENTAL CALCIUM) 500 MG chewable tablet Chew 1 tablet by mouth 2 (two) times daily.    diphenhydrAMINE (BENADRYL) 25 mg capsule Take 50 mg by mouth at bedtime as needed for sleep.    docusate sodium (COLACE) 100 MG capsule Take 100 mg by mouth 3 (three) times daily.     DULoxetine (CYMBALTA) 20 MG capsule Take 20 mg by mouth daily. 06/25/2020: Per pt, needs 30mg  now. States he explained this to hospitalist   fish oil-omega-3 fatty acids 1000 MG capsule Take 2 g by mouth 3 (three) times daily.    gabapentin (NEURONTIN) 300 MG capsule Take 600 mg by mouth 3 (three) times daily.    Garlic Oil 1000 MG CAPS Take 1,000 mg by mouth daily.     glucosamine-chondroitin 500-400 MG tablet Take 1 tablet by mouth 3 (three) times daily.    ketoconazole (NIZORAL) 2 % cream Apply 1 application topically as needed for irritation. Apply to affected area.    ketoconazole (NIZORAL) 2 % shampoo Apply 1 application topically 3 (three) times a week.    Lactobacillus CHEW Chew 2 tablets by mouth 2 (two)  times daily.    metoprolol tartrate (LOPRESSOR) 25 MG tablet Take 12.5 mg by mouth 2 (two) times daily.     Multiple Vitamin (MULTIVITAMIN WITH MINERALS) TABS Take 1 tablet by mouth daily.    oxyCODONE (OXY IR/ROXICODONE) 5 MG immediate release tablet Take 1 tablet (5 mg total) by mouth every 6 (six) hours as needed for severe pain.    oxyCODONE (OXYCONTIN) 10 mg 12 hr tablet Take 1 tablet (10 mg total) by mouth 3 (three) times daily.    polyethylene glycol (MIRALAX / GLYCOLAX) 17 g packet Take 17 g by mouth daily.    senna (SENOKOT) 8.6 MG tablet Take 4 tablets by mouth 2 (two) times daily as needed for constipation.     sildenafil (VIAGRA) 50 MG tablet Take 50 mg by mouth daily as needed for erectile dysfunction.    tiZANidine (ZANAFLEX) 4 MG capsule Take 4 mg by mouth daily.     tiZANidine (  ZANAFLEX) 4 MG tablet Take 1 tablet (4 mg total) by mouth daily as needed for muscle spasms.    Vitamin D, Cholecalciferol, 1000 units CAPS Take 1,000 Units by mouth 5 (five) times daily.    No facility-administered encounter medications on file as of 03/03/2023.         Aneurysm, common iliac artery (HCC)    Ct just done at [facility] Stable       Age-related osteoporosis without current pathological fracture - Primary    [facility]       Relevant Medications   alendronate (FOSAMAX) 70 MG tablet    Return in about 1 year (around 03/02/2024), or if symptoms worsen or fail to improve.    Donato Schultz, DO

## 2023-03-03 NOTE — Assessment & Plan Note (Signed)
Well controlled, no changes to meds. Encouraged heart healthy diet such as the DASH diet and exercise as tolerated.  °

## 2023-03-03 NOTE — Assessment & Plan Note (Signed)
Encourage heart healthy diet such as MIND or DASH diet, increase exercise, avoid trans fats, simple carbohydrates and processed foods, consider a krill or fish or flaxseed oil cap daily.  °

## 2023-03-03 NOTE — Assessment & Plan Note (Signed)
Ct just done at [facility] Stable

## 2023-03-03 NOTE — Assessment & Plan Note (Signed)
[facility] On [facility] weekly

## 2023-03-24 LAB — LAB REPORT - SCANNED
A1c: 6
TSH: 1.38 (ref 0.41–5.90)

## 2023-05-07 LAB — HM DEXA SCAN

## 2023-05-19 LAB — BASIC METABOLIC PANEL (CC13): EGFR: 90

## 2023-10-06 LAB — LAB REPORT - SCANNED: A1c: 5.9

## 2024-03-08 ENCOUNTER — Ambulatory Visit: Payer: Medicare Other | Admitting: Family Medicine

## 2024-03-08 ENCOUNTER — Encounter: Payer: Self-pay | Admitting: Family Medicine

## 2024-03-08 VITALS — BP 110/60 | HR 65 | Temp 98.6°F | Resp 18 | Ht 76.0 in | Wt 179.8 lb

## 2024-03-08 DIAGNOSIS — G8929 Other chronic pain: Secondary | ICD-10-CM

## 2024-03-08 DIAGNOSIS — E785 Hyperlipidemia, unspecified: Secondary | ICD-10-CM

## 2024-03-08 DIAGNOSIS — M8589 Other specified disorders of bone density and structure, multiple sites: Secondary | ICD-10-CM | POA: Diagnosis not present

## 2024-03-08 DIAGNOSIS — I10 Essential (primary) hypertension: Secondary | ICD-10-CM

## 2024-03-08 DIAGNOSIS — G709 Myoneural disorder, unspecified: Secondary | ICD-10-CM

## 2024-03-08 DIAGNOSIS — M810 Age-related osteoporosis without current pathological fracture: Secondary | ICD-10-CM

## 2024-03-08 DIAGNOSIS — C642 Malignant neoplasm of left kidney, except renal pelvis: Secondary | ICD-10-CM | POA: Diagnosis not present

## 2024-03-08 DIAGNOSIS — I723 Aneurysm of iliac artery: Secondary | ICD-10-CM

## 2024-03-08 DIAGNOSIS — I700 Atherosclerosis of aorta: Secondary | ICD-10-CM | POA: Diagnosis not present

## 2024-03-08 DIAGNOSIS — I2510 Atherosclerotic heart disease of native coronary artery without angina pectoris: Secondary | ICD-10-CM | POA: Diagnosis not present

## 2024-03-08 NOTE — Assessment & Plan Note (Signed)
 Stable

## 2024-03-08 NOTE — Assessment & Plan Note (Signed)
[facility] 

## 2024-03-08 NOTE — Assessment & Plan Note (Signed)
 Per vascular at [facility]  reviewed and scanned in

## 2024-03-08 NOTE — Assessment & Plan Note (Signed)
Stable. On lipitor.

## 2024-03-08 NOTE — Assessment & Plan Note (Signed)
 On fosamax  Bmd reviewed and scanned in

## 2024-03-08 NOTE — Assessment & Plan Note (Addendum)
 Per [facility] reviewed

## 2024-03-08 NOTE — Progress Notes (Signed)
 Subjective:    Patient ID: Christopher Wheeler, male    DOB: 06-17-1946, 78 y.o.   MRN: [id number]  Chief Complaint  Patient presents with   Follow-up    HPI Patient is in today for f/u and to update his medical issues.  the patient sees [facility] for all of his care but wants to see us  so if he ends up in ER we have his records. Discussed the use of AI scribe software for clinical note transcription with the patient, who gave verbal consent to proceed.  History of Present Illness Christopher Wheeler is a 78 year old male who presents for a routine follow-up.  He has a history of lipoid pneumonia, which he attributes to taking mineral oil in liquid form rather than in capsules.  He has osteopenia, previously diagnosed as osteoporosis, and attributes the improvement to the use of Fosamax  and calcium  supplements. He is aware of the risk of falls.  In 2020, he underwent a procedure for a right common iliac artery aneurysm, which is stable post-procedure.  He has a history of a spermatocele, which was removed in April 2012. He currently has another one, but it is not causing pain and no intervention is planned.  He experiences swelling and redness in his feet, which has been evaluated by multiple specialists, but no definitive cause has been identified. His circulation is reportedly good despite the symptoms.  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 His PSA levels are checked as part of his routine blood work.  He has a history of skin changes, described as red spots that turn dark with age, which he believes are not cancerous. He is regularly inspected for these skin changes.   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 (right sided  weakness, C4 lesion)-right side muscle wasting    Past Surgical History:  Procedure Laterality Date   COLONOSCOPY W/ POLYPECTOMY  2016   [facility]   NEPHRECTOMY Left    partial L nephrectomy    TOTAL HIP ARTHROPLASTY  07/21/2012   Procedure: TOTAL HIP ARTHROPLASTY ANTERIOR APPROACH;  Surgeon: Donnice JONETTA Car, MD;  Location: [facility];  Service: Orthopedics;  Laterality: Left;    Family History  Problem Relation Age of Onset   Diabetes Sister    Diabetes Mellitus II Paternal Grandmother     Social History   Socioeconomic History   Marital status: Married    Spouse name: Not on file   Number of children: Not on file   Years of education: Not on file   Highest education level: Not on file  Occupational History   Not on file  Tobacco Use   Smoking status: Former    Current packs/day: 0.00    Types: Cigarettes, Pipe    Quit date: 07/09/2000    Years since quitting: 23.6   Smokeless tobacco: Never  Substance and Sexual Activity   Alcohol use: No    Comment: 1'2007-none since pain management tx.   Drug use: No   Sexual activity: Yes  Other Topics Concern   Not on file  Social History Narrative   Not on file   Social Drivers of Health   Financial Resource Strain: Low Risk  (02/14/2020)  Overall Financial Resource Strain (CARDIA)    Difficulty of Paying Living Expenses: Not hard at all  Food Insecurity: No Food Insecurity (02/14/2020)   Hunger Vital Sign    Worried About Running Out of Food in the Last Year: Never true    Ran Out of Food in the Last Year: Never true  Transportation Needs: No Transportation Needs (02/14/2020)   PRAPARE - Administrator, Civil Service (Medical): No    Lack of Transportation (Non-Medical): No  Physical Activity: Inactive (07/02/2019)   Received from [facility] visits prior to 09/28/2022.   Exercise Vital Sign    On average, how many days per week do you engage in moderate to strenuous exercise (like a brisk  walk)?: 0 days    On average, how many minutes do you engage in exercise at this level?: 0 min  Stress: No Stress Concern Present (07/02/2019)   Received from [facility] visits prior to 09/28/2022.   [employer] of Occupational Health - Occupational Stress Questionnaire    Feeling of Stress : Only a little  Social Connections: Moderately Isolated (07/02/2019)   Received from [facility] visits prior to 09/28/2022.   Social Connection and Isolation Panel    In a typical week, how many times do you talk on the phone with family, friends, or neighbors?: Three times a week    How often do you get together with friends or relatives?: Never    How often do you attend church or religious services?: More than 4 times per year    Do you belong to any clubs or organizations such as church groups, unions, fraternal or athletic groups, or school groups?: No    How often do you attend meetings of the clubs or organizations you belong to?: Never    Are you married, widowed, divorced, separated, never married, or living with a partner?: Widowed  Intimate Partner Violence: Unknown (07/02/2019)   Received from [facility] visits prior to 09/28/2022.   Humiliation, Afraid, Rape, and Kick questionnaire    Within the last year, have you been afraid of your partner or ex-partner?: Not asked    Within the last year, have you been humiliated or emotionally abused in other ways by your partner or ex-partner?: Not asked    Within the last year, have you been kicked, hit, slapped, or otherwise physically hurt by your partner or ex-partner?: Not asked    Within the last year, have you been raped or forced to have any kind of sexual activity by your partner or ex-partner?: Not asked    Outpatient Medications Prior to Visit  Medication Sig Dispense Refill   alendronate  (FOSAMAX ) 70 MG tablet Take 1 tablet (70 mg total) by mouth every 7 (seven) days. Take  with a full glass of water  on an empty stomach.     aspirin  EC 81 MG tablet Take 81 mg by mouth at bedtime.      atorvastatin  (LIPITOR) 40 MG tablet Take 40 mg by mouth at bedtime.      B Complex-C (SUPER B COMPLEX PO) Take 1 tablet by mouth daily with lunch.      calcium  carbonate (TUMS - DOSED IN MG ELEMENTAL CALCIUM ) 500 MG chewable tablet Chew 1 tablet by mouth 2 (two) times daily.     docusate sodium  (COLACE) 100 MG capsule Take 100 mg by mouth 3 (three) times daily.  DULoxetine  (CYMBALTA ) 20 MG capsule Take 20 mg by mouth daily.     gabapentin  (NEURONTIN ) 300 MG capsule Take 600 mg by mouth 3 (three) times daily.     Garlic Oil 1000 MG CAPS Take 1,000 mg by mouth daily.      glucosamine-chondroitin 500-400 MG tablet Take 1 tablet by mouth 3 (three) times daily.     ketoconazole (NIZORAL) 2 % cream Apply 1 application topically as needed for irritation. Apply to affected area.     ketoconazole (NIZORAL) 2 % shampoo Apply 1 application topically 3 (three) times a week.     metoprolol  tartrate (LOPRESSOR ) 25 MG tablet Take 12.5 mg by mouth 2 (two) times daily.      Multiple Vitamin (MULTIVITAMIN WITH MINERALS) TABS Take 1 tablet by mouth daily.     oxyCODONE  (OXY IR/ROXICODONE ) 5 MG immediate release tablet Take 1 tablet (5 mg total) by mouth every 6 (six) hours as needed for severe pain. 10 tablet 0   oxyCODONE  (OXYCONTIN ) 10 mg 12 hr tablet Take 1 tablet (10 mg total) by mouth 3 (three) times daily. 14 tablet 0   polyethylene glycol (MIRALAX  / GLYCOLAX ) 17 g packet Take 17 g by mouth daily. 14 each 0   senna (SENOKOT) 8.6 MG tablet Take 4 tablets by mouth 2 (two) times daily as needed for constipation.      sildenafil (VIAGRA) 50 MG tablet Take 50 mg by mouth daily as needed for erectile dysfunction.     tiZANidine  (ZANAFLEX ) 4 MG tablet Take 1 tablet (4 mg total) by mouth daily as needed for muscle spasms. 10 tablet 0   Vitamin D , Cholecalciferol , 1000 units CAPS Take 1,000 Units by  mouth 5 (five) times daily.     fish oil-omega-3 fatty acids 1000 MG capsule Take 2 g by mouth 3 (three) times daily.     Lactobacillus CHEW Chew 2 tablets by mouth 2 (two) times daily.     tiZANidine  (ZANAFLEX ) 4 MG capsule Take 4 mg by mouth daily.  (Patient not taking: Reported on 03/08/2024)     diphenhydrAMINE  (BENADRYL ) 25 mg capsule Take 50 mg by mouth at bedtime as needed for sleep. (Patient not taking: Reported on 03/08/2024)     No facility-administered medications prior to visit.    Allergies  Allergen Reactions   Cefazolin  Anaphylaxis    Refractory hypotension and rash intraoperatively, cefazolin  vs rocuronium   Cephalosporins Anaphylaxis   Penicillins Anaphylaxis and Rash   Rocuronium Anaphylaxis    Refractory hypotension and rash intraoperatively, rocuronium vs cefazolin    Methadone Other (See Comments)    respiration and blood pressure depressed    Levofloxacin Other (See Comments)    Per patient:Possible Permanent Nerve Damage   Morphine And Codeine Other (See Comments)    respiration and blood pressure depressed    Other Other (See Comments)    Potato, Hops, Peanut butter: coughing   Simvastatin Nausea And Vomiting   Temazepam Other (See Comments)    Excessive sleepiness   Doxycycline Rash   Erythromycin Rash    Review of Systems  Constitutional:  Negative for fever and malaise/fatigue.  HENT:  Negative for congestion.   Eyes:  Negative for blurred vision.  Respiratory:  Negative for shortness of breath.   Cardiovascular:  Negative for chest pain, palpitations and leg swelling.  Gastrointestinal:  Negative for abdominal pain, blood in stool and nausea.  Genitourinary:  Negative for dysuria and frequency.  Musculoskeletal:  Negative for falls.  Skin:  Negative  for rash.  Neurological:  Positive for weakness. Negative for dizziness, loss of consciousness and headaches.  Endo/Heme/Allergies:  Negative for environmental allergies.  Psychiatric/Behavioral:   Negative for depression. The patient is not nervous/anxious.        Objective:    Physical Exam Vitals and nursing note reviewed.  Constitutional:      General: He is not in acute distress.    Appearance: Normal appearance. He is well-developed.  HENT:     Head: Normocephalic and atraumatic.  Eyes:     General: No scleral icterus.       Right eye: No discharge.        Left eye: No discharge.  Cardiovascular:     Rate and Rhythm: Normal rate and regular rhythm.     Heart sounds: No murmur heard. Pulmonary:     Effort: Pulmonary effort is normal. No respiratory distress.     Breath sounds: Normal breath sounds.  Musculoskeletal:        General: Normal range of motion.     Cervical back: Normal range of motion and neck supple.     Right lower leg: No edema.     Left lower leg: No edema.     Comments: Pt walks with a walker  Weakness   Skin:    General: Skin is warm and dry.  Neurological:     Mental Status: He is alert and oriented to person, place, and time.  Psychiatric:        Mood and Affect: Mood normal.        Behavior: Behavior normal.        Thought Content: Thought content normal.        Judgment: Judgment normal.     BP 110/60 (BP Location: Left Arm, Patient Position: Sitting, Cuff Size: Normal)   Pulse 65   Temp 98.6 F (37 C) (Oral)   Resp 18   Ht 6' 4 (1.93 m)   Wt 179 lb 12.8 oz (81.6 kg)   SpO2 96%   BMI 21.89 kg/m  Wt Readings from Last 3 Encounters:  03/08/24 179 lb 12.8 oz (81.6 kg)  03/03/23 179 lb 3.2 oz (81.3 kg)  02/25/22 198 lb (89.8 kg)    Diabetic Foot Exam - Simple   No data filed    Lab Results  Component Value Date   WBC 9.0 06/25/2020   HGB 13.0 06/25/2020   HCT 39.6 06/25/2020   PLT 148 (L) 06/25/2020   GLUCOSE 141 (H) 06/25/2020   ALT 31 06/25/2020   AST 34 06/25/2020   NA 139 06/25/2020   K 4.0 06/25/2020   CL 104 06/25/2020   CREATININE 1.02 06/25/2020   BUN 8 06/25/2020   CO2 24 06/25/2020   TSH 1.182  06/25/2020   INR 1.07 07/09/2012    Lab Results  Component Value Date   TSH 1.182 06/25/2020   Lab Results  Component Value Date   WBC 9.0 06/25/2020   HGB 13.0 06/25/2020   HCT 39.6 06/25/2020   MCV 98.5 06/25/2020   PLT 148 (L) 06/25/2020   Lab Results  Component Value Date   NA 139 06/25/2020   K 4.0 06/25/2020   CO2 24 06/25/2020   GLUCOSE 141 (H) 06/25/2020   BUN 8 06/25/2020   CREATININE 1.02 06/25/2020   BILITOT 1.0 06/25/2020   ALKPHOS 48 06/25/2020   AST 34 06/25/2020   ALT 31 06/25/2020   PROT 5.7 (L) 06/25/2020   ALBUMIN 3.1 (L) 06/25/2020  CALCIUM  8.8 (L) 06/25/2020   ANIONGAP 11 06/25/2020   No results found for: CHOL No results found for: HDL No results found for: LDLCALC No results found for: TRIG No results found for: CHOLHDL No results found for: YHAJ8R     Assessment & Plan:  Age-related osteoporosis without current pathological fracture  Coronary artery disease involving native coronary artery of native heart without angina pectoris [facility] & Plan[facility]   Essential hypertension Assessment & Plan: Well controlled, no changes to meds. Encouraged heart healthy diet such as the DASH diet and exercise as tolerated.     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 The pulmonologist confirms stability as long as the condition remains non-progressive. Avoid further mineral oil exposure and continue  monitoring with the pulmonologist.  Clear cell carcinoma of kidney, post-surgical, under surveillance   Post-surgical status for clear cell carcinoma of the kidney is under regular surveillance with no evidence of recurrence. Continue regular surveillance with the urologist.  Right common iliac artery aneurysm, post-repair, under surveillance   Status post-repair of the right common iliac artery aneurysm in 2020 is stable under surveillance. There is potential for life-threatening instability if undetected. Continue regular surveillance and report any new symptoms immediately.  Osteopenia, improved from prior osteoporosis   Osteopenia on recent bone density scan has improved from prior osteoporosis due to Fosamax  and calcium  supplementation. Continue Fosamax  and calcium  supplementation with regular bone density monitoring.  Chronic bilateral foot swelling and erythema, etiology unclear   Chronic bilateral foot swelling and erythema have an unclear etiology. Good circulation is reported, but no definitive diagnosis from multiple evaluations.  Spermatocele, stable, asymptomatic   Asymptomatic spermatocele is stable, not causing pain or connected to surrounding structures, thus not requiring intervention. Continue monitoring for any changes.  Seborrheic keratosis   Seborrheic keratosis is present as non-cancerous skin lesions. Continue regular skin inspections.  I personally spent a total of 45 minutes in the care of the patient today including preparing to see the patient, getting/reviewing separately obtained history, performing a medically appropriate exam/evaluation, documenting clinical information in the EHR, and coordinating care.  Malai Lady R Lowne Chase, DO

## 2024-03-08 NOTE — Assessment & Plan Note (Signed)
 Well controlled, no changes to meds. Encouraged heart healthy diet such as the DASH diet and exercise as tolerated.

## 2024-05-06 DIAGNOSIS — Z961 Presence of intraocular lens: Secondary | ICD-10-CM | POA: Diagnosis not present

## 2024-05-06 DIAGNOSIS — Z9849 Cataract extraction status, unspecified eye: Secondary | ICD-10-CM | POA: Diagnosis not present

## 2024-05-06 DIAGNOSIS — H5203 Hypermetropia, bilateral: Secondary | ICD-10-CM | POA: Diagnosis not present

## 2024-05-06 DIAGNOSIS — H43813 Vitreous degeneration, bilateral: Secondary | ICD-10-CM | POA: Diagnosis not present

## 2024-05-06 DIAGNOSIS — H524 Presbyopia: Secondary | ICD-10-CM | POA: Diagnosis not present

## 2024-05-06 DIAGNOSIS — H53143 Visual discomfort, bilateral: Secondary | ICD-10-CM | POA: Diagnosis not present

## 2024-05-06 DIAGNOSIS — H52223 Regular astigmatism, bilateral: Secondary | ICD-10-CM | POA: Diagnosis not present

## 2024-05-06 DIAGNOSIS — H40059 Ocular hypertension, unspecified eye: Secondary | ICD-10-CM | POA: Diagnosis not present

## 2024-05-19 ENCOUNTER — Telehealth: Payer: Self-pay | Admitting: Family Medicine

## 2024-05-19 NOTE — Telephone Encounter (Signed)
 Copied from CRM [id number]. Topic: Medicare AWV >> May 19, 2024 10:23 AM Nathanel DEL wrote: Reason for CRM: Called unable to LVM 05/19/24 AWV appt changed from telephone/video to office due to Traditional Medicare changes. All Medicare A/B patient are to do office visits only. Please call to confirm   Nathanel Paschal; Care Guide Ambulatory Clinical Support [place] [facility] Direct Dial: [phone]

## 2024-05-27 ENCOUNTER — Ambulatory Visit

## 2025-03-14 ENCOUNTER — Ambulatory Visit: Admitting: Family Medicine
